# Patient Record
Sex: Female | Born: 2000 | Race: White | Hispanic: No | Marital: Married | Smoking: Never smoker
Health system: Southern US, Community
[De-identification: ages and names within clinical notes are randomized; demographics above are authoritative.]

## PROBLEM LIST (undated history)

## (undated) DIAGNOSIS — J45909 Unspecified asthma, uncomplicated: Secondary | ICD-10-CM

## (undated) DIAGNOSIS — R55 Syncope and collapse: Secondary | ICD-10-CM

## (undated) HISTORY — PX: NO PAST SURGERIES: SHX2092

## (undated) HISTORY — DX: Unspecified asthma, uncomplicated: J45.909

---
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
## 2021-10-09 ENCOUNTER — Other Ambulatory Visit: Payer: Self-pay | Admitting: Physician Assistant

## 2021-10-09 DIAGNOSIS — R519 Headache, unspecified: Secondary | ICD-10-CM

## 2021-10-09 DIAGNOSIS — H532 Diplopia: Secondary | ICD-10-CM

## 2021-10-21 ENCOUNTER — Ambulatory Visit
Admission: RE | Admit: 2021-10-21 | Discharge: 2021-10-21 | Disposition: A | Discharge status: Home / Self Care | Payer: Medicaid Other | Source: Ambulatory Visit | Attending: Physician Assistant | Admitting: Physician Assistant

## 2021-10-21 DIAGNOSIS — R519 Headache, unspecified: Secondary | ICD-10-CM | POA: Insufficient documentation

## 2021-10-21 DIAGNOSIS — H532 Diplopia: Secondary | ICD-10-CM | POA: Insufficient documentation

## 2021-10-21 MED ORDER — GADOBUTROL 1 MMOL/ML IV SOLN
5.0000 mL | Freq: Once | INTRAVENOUS | Status: AC | PRN
  Administered 2021-10-21: 5 mL via INTRAVENOUS

## 2022-06-15 ENCOUNTER — Ambulatory Visit
Admission: EM | Admit: 2022-06-15 | Discharge: 2022-06-15 | Disposition: A | Discharge status: Home / Self Care | Payer: Medicaid Other | Attending: Emergency Medicine | Admitting: Emergency Medicine

## 2022-06-15 ENCOUNTER — Other Ambulatory Visit: Payer: Self-pay

## 2022-06-15 ENCOUNTER — Encounter: Payer: Self-pay | Admitting: Emergency Medicine

## 2022-06-15 DIAGNOSIS — U071 COVID-19: Secondary | ICD-10-CM

## 2022-06-15 DIAGNOSIS — Z20822 Contact with and (suspected) exposure to covid-19: Secondary | ICD-10-CM | POA: Insufficient documentation

## 2022-06-15 DIAGNOSIS — J4521 Mild intermittent asthma with (acute) exacerbation: Secondary | ICD-10-CM | POA: Insufficient documentation

## 2022-06-15 LAB — RESP PANEL BY RT-PCR (FLU A&B, COVID) ARPGX2
Influenza A by PCR: NEGATIVE
Influenza B by PCR: NEGATIVE
SARS Coronavirus 2 by RT PCR: POSITIVE — AB

## 2022-06-15 MED ORDER — NIRMATRELVIR/RITONAVIR (PAXLOVID)TABLET: 3.0000 | ORAL_TABLET | Freq: Two times a day (BID) | ORAL | 0 refills | Status: AC

## 2022-06-15 MED ORDER — IPRATROPIUM BROMIDE 0.06 % NA SOLN: 2.0000 | Freq: Four times a day (QID) | NASAL | 0 refills | Status: DC

## 2022-06-15 MED ORDER — ALBUTEROL SULFATE HFA 108 (90 BASE) MCG/ACT IN AERS: 1.0000 | INHALATION_SPRAY | RESPIRATORY_TRACT | 0 refills | Status: DC | PRN

## 2022-06-15 MED ORDER — IBUPROFEN 600 MG PO TABS: 600.0000 mg | ORAL_TABLET | Freq: Four times a day (QID) | ORAL | 0 refills | Status: DC | PRN

## 2022-06-15 MED ORDER — AEROCHAMBER MV MISC: 1 refills | Status: DC

## 2022-06-15 MED ORDER — PROMETHAZINE-DM 6.25-15 MG/5ML PO SYRP: 5.0000 mL | ORAL_SOLUTION | Freq: Four times a day (QID) | ORAL | 0 refills | Status: DC | PRN

## 2022-06-15 NOTE — Discharge Instructions (Addendum)
Your COVID is positive.  Finish the Paxlovid, even if you feel better.  2 puffs from your albuterol inhaler using your spacer every 4 hours for 2 days, then every 6 hours for 2 days, then as needed.  You can back off on the albuterol if you start to improve sooner.  Promethazine DM will also help with cough.  Start Mucinex-D to keep the mucous thin and to decongest you.   You may take 600 mg of motrin with 1000 mg of tylenol up to 3-4 times a day as needed for pain. This is an effective combination for pain.  Use a NeilMed sinus rinse with distilled water as often as you want to to reduce nasal congestion. Follow the directions on the box.   Here is a list of primary care providers who are taking new patients:  [facility] [place] Dr. Joseph Berkshire (sports medicine) Dr. Elizabeth Sauer [address] [place] [address] [phone]  [facility] at [facility] [address] [place], [place] [address] [phone]  [facility] [place] [address]  [place] [address]  [phone]  [facility] [address] [place], [place] [address] [phone]  [facility] [address] [place]  [phone] [place], [place] [address]  Here are clinics/ other resources who will see you if you do not have insurance. Some have certain criteria that you must meet. Call them and find out what they are:  [facility]: [address] [place], [place] [address] Phone: [phone] Hours: First and Third Saturdays of each Month, 9 a.m. - 1 p.m.  [facility]: [address], [address] [place], [place] [address] Phone: [phone] Hours: Tuesday, 4 p.m. - 8 p.m. Thursday, 1 p.m. - 8 p.m. Wednesday, 9 a.m. - [facility] [address] [place], [place] [address] Phone: [phone] Pharmacy Phone Number: [phone] Dental Phone Number: [phone] [facility] Insurance Help: [phone]  Dental Hours: Monday - Thursday, 8 a.m. - 6  p.m.  [facility] [address], [place], [place] [address] Phone: [phone] Pharmacy Phone Number: [phone] [facility] Insurance Help: [phone]  [facility] [address] [place] [place], [place] [address] Phone: [phone] Pharmacy Phone Number: [phone] [facility] Insurance Help: [phone]  [facility] [address] [place], [place] [address] Phone: [phone] [facility] Insurance Help: [phone]   [facility] [address] [place], [place] [address] Phone: [phone]  Go to www.goodrx.com  or www.costplusdrugs.com to look up your medications. This will give you a list of where you can find your prescriptions at the most affordable prices. Or ask the pharmacist what the cash price is, or if they have any other discount programs available to help make your medication more affordable. This can be less expensive than what you would pay with insurance.

## 2022-06-15 NOTE — ED Provider Notes (Signed)
HPI  SUBJECTIVE:  Andrea Flowers is a 21 y.o. female who presents with body aches, headaches, nasal congestion, rhinorrhea, sore throat, postnasal drip, cough occasionally productive of scant white mucus, wheezing, shortness of breath with exertion and diffuse chest tightness starting yesterday.  Her grandmother, who she was with over Thanksgiving, tested positive for COVID yesterday.  No fevers, loss of sense of smell or taste, nausea, vomiting, diarrhea.  No known flu exposure.  She did not get the COVID or flu vaccines.  She has tried extra strength Sudafed, cough drops and hot liquids without improvement in her symptoms.  Symptoms are worse with exertion.  She has a past medical history of asthma, does not have an inhaler at home.  She has never been admitted for her asthma, no recent steroid use.  LMP: November 15.  Denies possibility of being pregnant.  PCP: None.   Past Medical History:  Diagnosis Date   Asthma    Syncope     Past Surgical History:  Procedure Laterality Date   NO PAST SURGERIES      Family History  Problem Relation Age of Onset   Diabetes Father     Social History   Tobacco Use   Smoking status: Never   Smokeless tobacco: Never  Vaping Use   Vaping Use: Former  Substance Use Topics   Alcohol use: No    Alcohol/week: 0.0 standard drinks of alcohol   Drug use: No    No current facility-administered medications for this encounter.  Current Outpatient Medications:    albuterol (VENTOLIN HFA) 108 (90 Base) MCG/ACT inhaler, Inhale 1-2 puffs into the lungs every 4 (four) hours as needed for wheezing or shortness of breath., Disp: 1 each, Rfl: 0   ibuprofen (ADVIL) 600 MG tablet, Take 1 tablet (600 mg total) by mouth every 6 (six) hours as needed., Disp: 30 tablet, Rfl: 0   ipratropium (ATROVENT) 0.06 % nasal spray, Place 2 sprays into both nostrils 4 (four) times daily., Disp: 15 mL, Rfl: 0   nirmatrelvir/ritonavir EUA (PAXLOVID) 20 x 150 MG & 10 x 100MG   TABS, Take 3 tablets by mouth 2 (two) times daily for 5 days. Patient GFR is 129. Take nirmatrelvir (150 mg) two tablets twice daily for 5 days and ritonavir (100 mg) one tablet twice daily for 5 days., Disp: 30 tablet, Rfl: 0   promethazine-dextromethorphan (PROMETHAZINE-DM) 6.25-15 MG/5ML syrup, Take 5 mLs by mouth 4 (four) times daily as needed for cough., Disp: 118 mL, Rfl: 0   Spacer/Aero-Holding Chambers (AEROCHAMBER MV) inhaler, Use as instructed, Disp: 1 each, Rfl: 1   escitalopram (LEXAPRO) 10 MG tablet, Take 10 mg by mouth daily. (Patient not taking: Reported on 06/15/2022), Disp: , Rfl:    Ferrous Sulfate (IRON) 325 (65 Fe) MG TABS, Take 1 tablet by mouth daily., Disp: , Rfl:   No Known Allergies   ROS  As noted in HPI.   Physical Exam  BP 130/83 (BP Location: Right Arm)   Pulse (!) 120   Temp 100.3 F (37.9 C) (Oral)   Resp 20   LMP 06/02/2022   SpO2 100%   Constitutional: Well developed, well nourished, no acute distress Eyes:  EOMI, conjunctiva normal bilaterally HENT: Normocephalic, atraumatic,mucus membranes moist and erythematous, swollen redness.  Clear nasal congestion.  Mild maxillary sinus tenderness.  Normal tonsils without exudates.  Uvula midline.  No postnasal drip. Neck: No cervical lymphadenopathy Respiratory: Normal inspiratory effort, occasional expiratory wheezing.  Positive anterior, lateral chest wall tenderness.  Cardiovascular: Regular tachycardia, no murmurs, rubs, gallops GI: nondistended skin: No rash, skin intact Musculoskeletal: no deformities Neurologic: Alert & oriented x 3, no focal neuro deficits Psychiatric: Speech and behavior appropriate   ED Course   Medications - No data to display  Orders Placed This Encounter  Procedures   Resp Panel by RT-PCR (Flu A&B, Covid) Anterior Nasal Swab    Standing Status:   Standing    Number of Occurrences:   1    Results for orders placed or performed during the hospital encounter of  06/15/22 (from the past 24 hour(s))  Resp Panel by RT-PCR (Flu A&B, Covid) Anterior Nasal Swab     Status: Abnormal   Collection Time: 06/15/22  2:54 PM   Specimen: Anterior Nasal Swab  Result Value Ref Range   SARS Coronavirus 2 by RT PCR POSITIVE (A) NEGATIVE   Influenza A by PCR NEGATIVE NEGATIVE   Influenza B by PCR NEGATIVE NEGATIVE   No results found.  ED Clinical Impression  1. COVID-19 virus infection   2. Close exposure to COVID-19 virus   3. Mild intermittent asthma with acute exacerbation      ED Assessment/Plan      Patient with an acute illness with systemic symptoms of tachycardia and fever.  Patient declined antipyretics.  Patient COVID-positive with an asthma exacerbation.  GFR 129 based on labs from 09/18/2021 Home with Paxlovid, albuterol inhaler with a spacer, Atrovent nasal spray, saline nasal irrigation, Mucinex D, Promethazine DM.  Follow-up with PCP of choice. Will provide primary care list.  Work note.  Discussed labs, MDM, treatment plan, and plan for follow-up with patient. Discussed sn/sx that should prompt return to the ED. patient agrees with plan.   Meds ordered this encounter  Medications   albuterol (VENTOLIN HFA) 108 (90 Base) MCG/ACT inhaler    Sig: Inhale 1-2 puffs into the lungs every 4 (four) hours as needed for wheezing or shortness of breath.    Dispense:  1 each    Refill:  0   ipratropium (ATROVENT) 0.06 % nasal spray    Sig: Place 2 sprays into both nostrils 4 (four) times daily.    Dispense:  15 mL    Refill:  0   promethazine-dextromethorphan (PROMETHAZINE-DM) 6.25-15 MG/5ML syrup    Sig: Take 5 mLs by mouth 4 (four) times daily as needed for cough.    Dispense:  118 mL    Refill:  0   Spacer/Aero-Holding Chambers (AEROCHAMBER MV) inhaler    Sig: Use as instructed    Dispense:  1 each    Refill:  1   ibuprofen (ADVIL) 600 MG tablet    Sig: Take 1 tablet (600 mg total) by mouth every 6 (six) hours as needed.    Dispense:  30  tablet    Refill:  0   nirmatrelvir/ritonavir EUA (PAXLOVID) 20 x 150 MG & 10 x 100MG  TABS    Sig: Take 3 tablets by mouth 2 (two) times daily for 5 days. Patient GFR is 129. Take nirmatrelvir (150 mg) two tablets twice daily for 5 days and ritonavir (100 mg) one tablet twice daily for 5 days.    Dispense:  30 tablet    Refill:  0      *This clinic note was created using Scientist, clinical (histocompatibility and immunogenetics). Therefore, there may be occasional mistakes despite careful proofreading.  ?    Domenick Gong, MD 06/16/22 [phone]

## 2022-06-15 NOTE — ED Triage Notes (Signed)
Patient has a family member that has tested positive for covid last week. Patient started feeling bad yesterday.  Patient has scratchy throat, cough and sinus drainage and chest tightness.    Patient took sudafed, little relief and has had cough drops

## 2022-12-01 DIAGNOSIS — S46911A Strain of unspecified muscle, fascia and tendon at shoulder and upper arm level, right arm, initial encounter: Secondary | ICD-10-CM | POA: Diagnosis not present

## 2022-12-01 DIAGNOSIS — M25511 Pain in right shoulder: Secondary | ICD-10-CM | POA: Diagnosis not present

## 2022-12-09 DIAGNOSIS — M25511 Pain in right shoulder: Secondary | ICD-10-CM | POA: Diagnosis not present

## 2022-12-31 DIAGNOSIS — M25511 Pain in right shoulder: Secondary | ICD-10-CM | POA: Diagnosis not present

## 2023-05-31 ENCOUNTER — Encounter: Payer: Self-pay | Admitting: Family Medicine

## 2023-05-31 ENCOUNTER — Other Ambulatory Visit: Payer: Self-pay | Admitting: Family Medicine

## 2023-05-31 ENCOUNTER — Ambulatory Visit: Payer: BC Managed Care – PPO | Admitting: Family Medicine

## 2023-05-31 VITALS — BP 110/84 | Ht 59.25 in | Wt 165.0 lb

## 2023-05-31 DIAGNOSIS — E042 Nontoxic multinodular goiter: Secondary | ICD-10-CM

## 2023-05-31 DIAGNOSIS — R7309 Other abnormal glucose: Secondary | ICD-10-CM

## 2023-05-31 DIAGNOSIS — Z1159 Encounter for screening for other viral diseases: Secondary | ICD-10-CM

## 2023-05-31 DIAGNOSIS — Z0000 Encounter for general adult medical examination without abnormal findings: Secondary | ICD-10-CM

## 2023-05-31 DIAGNOSIS — Z7689 Persons encountering health services in other specified circumstances: Secondary | ICD-10-CM

## 2023-05-31 DIAGNOSIS — Z114 Encounter for screening for human immunodeficiency virus [HIV]: Secondary | ICD-10-CM

## 2023-05-31 NOTE — Progress Notes (Signed)
Subjective:    Patient ID: Andrea Flowers, female    DOB: 2000/12/03, 22 y.o.   MRN: [id number]  Andrea Flowers is a 22 y.o. female presenting on 05/31/2023 for Establish Care  Previously patient here at [facility] 2016 Amy Krebs FNP.  Works at [employer] in [place] for [employer]. Lives in [place]  HPI  Discussed the use of AI scribe software for clinical note transcription with the patient, who gave verbal consent to proceed.  She reports no specific health concerns or symptoms, but was encouraged by family members to establish a primary care provider.  History of Ovarian Cyst Cervical CA Screening The patient's medical history includes a cyst on the ovaries approximately four to five years ago, which was managed at [facility].  She is due for GYN evaluation and pap smear screening age 78+ She will notify us of preferred GYN provider for upcoming referral  Multinodular Goiter Thyroid Fam History Thyroid Prior evaluation with Thyroid US in past with nodular goiter. 2021 and 2022 She saw [facility] in 2023 and they recommended monitoring, labs were normal. She also has a family history of thyroid issues, with both her mother and maternal grandmother having thyroid conditions, and an aunt with an unspecified thyroid condition.  The patient is currently taking a daily multivitamin. She had some basic blood work done last year, including a mildly elevated prediabetes A1c and a slight anemia four years ago. The patient was advised to have her thyroid checked due to family history, but this has not yet been done this year. She has expressed interest in having this checked at her next appointment.  Iron Deficiency Anemia Prior lab >4 years ago History of anemia due to menstrual blood loss Now has normalized.  Health Maintenance: Declines vaccines today Due routine HIV Hep C screening     05/31/2023   10:42 AM 03/07/2015    2:43 PM  Depression screen PHQ 2/9  Decreased  Interest 0 0  Down, Depressed, Hopeless 0 0  PHQ - 2 Score 0 0       No data to display            Past Medical History:  Diagnosis Date   Asthma    Syncope    Past Surgical History:  Procedure Laterality Date   NO PAST SURGERIES     Social History   Socioeconomic History   Marital status: Single    Spouse name: Not on file   Number of children: Not on file   Years of education: Not on file   Highest education level: Not on file  Occupational History   Not on file  Tobacco Use   Smoking status: Never   Smokeless tobacco: Never  Vaping Use   Vaping status: Former  Substance and Sexual Activity   Alcohol use: No    Alcohol/week: 0.0 standard drinks of alcohol   Drug use: No   Sexual activity: Not on file  Other Topics Concern   Not on file  Social History Narrative   Not on file   Social Determinants of Health   Financial Resource Strain: Not on file  Food Insecurity: Not on file  Transportation Needs: Not on file  Physical Activity: Not on file  Stress: Not on file  Social Connections: Not on file  Intimate Partner Violence: Not on file   Family History  Problem Relation Age of Onset   Thyroid disease Mother    Diabetes Father  Thyroid disease Maternal Grandmother    Breast cancer Maternal Grandmother    Colon cancer Maternal Grandfather 55       UNSURE EXACT AGE. Treated.   Prostate cancer Paternal Grandfather    Thyroid disease Maternal Aunt    Current Outpatient Medications on File Prior to Visit  Medication Sig   Multiple Vitamins-Minerals (WOMENS MULTIVITAMIN PO) Take by mouth.   [DISCONTINUED] budesonide-formoterol (SYMBICORT) 80-4.5 MCG/ACT inhaler Inhale 2 puffs into the lungs 2 (two) times daily.   No current facility-administered medications on file prior to visit.    Review of Systems Per HPI unless specifically indicated above      Objective:    BP 110/84   Ht 4' 11.25" (1.505 m)   Wt 165 lb (74.8 kg)   LMP 05/04/2023    BMI 33.05 kg/m   Wt Readings from Last 3 Encounters:  05/31/23 165 lb (74.8 kg)  08/10/20 125 lb (56.7 kg) (49%, Z= -0.03)*  08/10/20 125 lb (56.7 kg) (49%, Z= -0.03)*   * Growth percentiles are based on CDC (Girls, 2-20 Years) data.    Physical Exam Vitals and nursing note reviewed.  Constitutional:      General: She is not in acute distress.    Appearance: Normal appearance. She is well-developed. She is not diaphoretic.     Comments: Well-appearing, comfortable, cooperative  HENT:     Head: Normocephalic and atraumatic.  Eyes:     General:        Right eye: No discharge.        Left eye: No discharge.     Conjunctiva/sclera: Conjunctivae normal.  Cardiovascular:     Rate and Rhythm: Normal rate.  Pulmonary:     Effort: Pulmonary effort is normal.  Skin:    General: Skin is warm and dry.     Findings: No erythema or rash.  Neurological:     Mental Status: She is alert and oriented to person, place, and time.  Psychiatric:        Mood and Affect: Mood normal.        Behavior: Behavior normal.        Thought Content: Thought content normal.     Comments: Well groomed, good eye contact, normal speech and thoughts    I have personally reviewed the radiology report from 03/08/21 on [facility] Thyroid US.  US Thyroid  Anatomical Region Laterality Modality  Neck -- Ultrasound   Impression  --Stable 1.8 cm TIRADS 1 cystic nodule within the thyroid isthmus. --Multiple additional subcentimeter thyroid nodules which also do not meet TIRADS criteria for follow-up, similar to prior.  ________________________________ Allene Pyo 1 (0 points): Benign- No FNA indication TI-RADS 2 (2 points): Not suspicious- No FNA indicated TI-RADS 3 (3 points): Mildly suspicious- FNA is > or = 2.5 cm, follow if > or = 1.5 cm TI-RADS 4 (4-6 points): Moderately suspicious- FNA if > or = 1.5 or follow if > or = 1.0 cm TI-RADS 5 (7 or more points): Highly suspicious- FNA if >=1.0 cm, follow if >=0.5  cm NOTE:  The TI-RADS classification of thyroid nodules has been adopted to standardize risk stratification based on a common lexicon to inform practitioners about which nodules warrant biopsy.  The imaging criteria for TI-RADS criteria and documentation are available online at [email or web address].[email or web address]/  Please see below for data measurements: Right thyroid: Sagittal 5.2 cm; AP 1.6 cm; Transverse 1.5 cm, previously 5.4 x 1.7 x 1.5 cm Left thyroid: Sagittal 4.8 cm; AP 1.7 cm; Transverse  1.2 cm, previously 5.4 x 1.6 x 1.2 cm  Isthmus: 0.8 cm Narrative  EXAM: US THYROID DATE: 03/08/2021 11:20 PM ACCESSION: [id number] UN DICTATED: 03/09/2021 12:11 AM INTERPRETATION LOCATION: Main [facility]  CLINICAL INDICATION: 22 year old Female with neck discomfort, known thyroid cyst  COMPARISON: Thyroid ultrasound 07/01/2020  TECHNIQUE:  Ultrasound views of the thyroid were obtained using gray scale and limited color Doppler imaging.  FINDINGS: Thyroid size: See below for data measurements.  Thyroid echotexture: Multinodular thyroid. The largest nodule for reference is detailed below. Few additional subcentimeter thyroid nodules which do not meet TIRADS criteria for follow-up, similar to prior.  Nodule: 1 Size: 1.8 x 1.4 x 0.6 cm, previously 1.8 x 1.3 x 0.8 cm Location: Isthmus Composition: cystic or almost completely cystic (0). Few thin septations. Echogenicity: Anechoic (0) Shape: Not taller than wide (0) Margin: Smooth (0) Echogenic foci: None (0)  ACR TI-RADS total points: 0 ACR TI-RADS risk category: TI-RADS 0 ACR TI-RADS recommendation: No further follow up needed       Assessment & Plan:   Problem List Items Addressed This Visit   None Visit Diagnoses     Encounter to establish care with new doctor    -  Primary   Multinodular goiter       Abnormal glucose          Establish care as new patient here. Review outside records. Prior PCP at [facility] 8 years ago.    Multinodular Goiter Thyroid Family History of Thyroid Disease Prior evaluation by imaging US Thyroid in 2022 and Endocrinology in 2023, [facility] No change documented on Thyroid US from 2021 to 2022. Endocrine recommended future repeat Thyroid US Discussed family history of thyroid disease. No current symptoms reported. Recheck thyroid panel in 3 months.  Prediabetes Last Lab A1c 5.8 2023 Check lab in 3 months Encourage healthy lifestyle diet  History of Anemia Previous history of mild anemia related to menstrual cycle. No current symptoms reported. -Plan to recheck iron levels and complete blood count in 3 months.  Prevention Screening Family history of breast, prostate, and colon cancer.  -Age 62+ will Plan to refer to GYN for cervical cancer screening (Pap smear). Patient to confirm preferred provider.       No orders of the defined types were placed in this encounter.     Follow up plan: Return in about 3 months (around 08/31/2023) for 3 month fasting lab > 1 week later Annual Physical.  Future labs 08/25/23 CMET CBC TSH T4 A1c HIV Hep C  Saralyn Pilar, DO [facility] 05/31/2023, 10:00 AM

## 2023-05-31 NOTE — Patient Instructions (Addendum)
Thank you for coming to the office today.  Look into the GYN options - Usually refer to [facility] OR - [facility] and [place]) - Let me know on a message or call which doctor/provider you like or which office, or we can talk about it next time.  DUE for FASTING BLOOD WORK (no food or drink after midnight before the lab appointment, only water or coffee without cream/sugar on the morning of)  SCHEDULE "Lab Only" visit in the morning at the clinic for lab draw in 3 MONTHS   - Make sure Lab Only appointment is at about 1 week before your next appointment, so that results will be available  For Lab Results, once available within 2-3 days of blood draw, you can can log in to MyChart online to view your results and a brief explanation. Also, we can discuss results at next follow-up visit.   Please schedule a Follow-up Appointment to: Return in about 3 months (around 08/31/2023) for 3 month fasting lab > 1 week later Annual Physical.  If you have any other questions or concerns, please feel free to call the office or send a message through MyChart. You may also schedule an earlier appointment if necessary.  Additionally, you may be receiving a survey about your experience at our office within a few days to 1 week by e-mail or mail. We value your feedback.  Saralyn Pilar, DO [facility], [place]

## 2023-08-25 ENCOUNTER — Other Ambulatory Visit: Payer: Self-pay

## 2023-08-25 DIAGNOSIS — E042 Nontoxic multinodular goiter: Secondary | ICD-10-CM | POA: Diagnosis not present

## 2023-08-25 DIAGNOSIS — R7309 Other abnormal glucose: Secondary | ICD-10-CM | POA: Diagnosis not present

## 2023-08-25 DIAGNOSIS — Z114 Encounter for screening for human immunodeficiency virus [HIV]: Secondary | ICD-10-CM

## 2023-08-25 DIAGNOSIS — Z1159 Encounter for screening for other viral diseases: Secondary | ICD-10-CM

## 2023-08-25 DIAGNOSIS — Z0000 Encounter for general adult medical examination without abnormal findings: Secondary | ICD-10-CM

## 2023-08-26 LAB — COMPLETE METABOLIC PANEL WITH GFR
AG Ratio: 1.5 (calc) (ref 1.0–2.5)
ALT: 15 U/L (ref 6–29)
AST: 15 U/L (ref 10–30)
Albumin: 4.3 g/dL (ref 3.6–5.1)
Alkaline phosphatase (APISO): 105 U/L (ref 31–125)
BUN: 12 mg/dL (ref 7–25)
CO2: 23 mmol/L (ref 20–32)
Calcium: 9.6 mg/dL (ref 8.6–10.2)
Chloride: 105 mmol/L (ref 98–110)
Creat: 0.6 mg/dL (ref 0.50–0.96)
Globulin: 2.9 g/dL (ref 1.9–3.7)
Glucose, Bld: 101 mg/dL — ABNORMAL HIGH (ref 65–99)
Potassium: 4.3 mmol/L (ref 3.5–5.3)
Sodium: 137 mmol/L (ref 135–146)
Total Bilirubin: 0.4 mg/dL (ref 0.2–1.2)
Total Protein: 7.2 g/dL (ref 6.1–8.1)
eGFR: 131 mL/min/{1.73_m2} (ref >=60)

## 2023-08-26 LAB — CBC WITH DIFFERENTIAL/PLATELET
Absolute Lymphocytes: 2066 {cells}/uL (ref 850–3900)
Absolute Monocytes: 496 {cells}/uL (ref 200–950)
Basophils Absolute: 51 {cells}/uL (ref 0–200)
Basophils Relative: 0.7 %
Eosinophils Absolute: 131 {cells}/uL (ref 15–500)
Eosinophils Relative: 1.8 %
HCT: 39.9 % (ref 35.0–45.0)
Hemoglobin: 13.2 g/dL (ref 11.7–15.5)
MCH: 27.6 pg (ref 27.0–33.0)
MCHC: 33.1 g/dL (ref 32.0–36.0)
MCV: 83.5 fL (ref 80.0–100.0)
MPV: 10.3 fL (ref 7.5–12.5)
Monocytes Relative: 6.8 %
Neutro Abs: 4555 {cells}/uL (ref 1500–7800)
Neutrophils Relative %: 62.4 %
Platelets: 324 10*3/uL (ref 140–400)
RBC: 4.78 10*6/uL (ref 3.80–5.10)
RDW: 13.4 % (ref 11.0–15.0)
Total Lymphocyte: 28.3 %
WBC: 7.3 10*3/uL (ref 3.8–10.8)

## 2023-08-26 LAB — T4, FREE: Free T4: 1.2 ng/dL (ref 0.8–1.8)

## 2023-08-26 LAB — HEPATITIS C ANTIBODY: Hepatitis C Ab: NONREACTIVE

## 2023-08-26 LAB — HIV ANTIBODY (ROUTINE TESTING W REFLEX): HIV 1&2 Ab, 4th Generation: NONREACTIVE

## 2023-08-26 LAB — HEMOGLOBIN A1C
Hgb A1c MFr Bld: 5.7 %{Hb} — ABNORMAL HIGH (ref <5.7)
Mean Plasma Glucose: 117 mg/dL
eAG (mmol/L): 6.5 mmol/L

## 2023-08-26 LAB — TSH: TSH: 1.43 m[IU]/L

## 2023-09-01 ENCOUNTER — Ambulatory Visit: Payer: BC Managed Care – PPO | Admitting: Family Medicine

## 2023-09-01 ENCOUNTER — Other Ambulatory Visit: Payer: Self-pay

## 2023-09-01 ENCOUNTER — Other Ambulatory Visit: Payer: Self-pay | Admitting: Family Medicine

## 2023-09-01 ENCOUNTER — Encounter: Payer: Self-pay | Admitting: Family Medicine

## 2023-09-01 VITALS — BP 108/68 | HR 88 | Ht 59.25 in | Wt 175.0 lb

## 2023-09-01 DIAGNOSIS — E669 Obesity, unspecified: Secondary | ICD-10-CM

## 2023-09-01 DIAGNOSIS — Z0000 Encounter for general adult medical examination without abnormal findings: Secondary | ICD-10-CM | POA: Diagnosis not present

## 2023-09-01 DIAGNOSIS — R7309 Other abnormal glucose: Secondary | ICD-10-CM

## 2023-09-01 DIAGNOSIS — E042 Nontoxic multinodular goiter: Secondary | ICD-10-CM

## 2023-09-01 MED ORDER — CONTRAVE 8-90 MG PO TB12: ORAL_TABLET | ORAL | Status: DC

## 2023-09-01 NOTE — Progress Notes (Signed)
Subjective:    Patient ID: Andrea Flowers, female    DOB: 07/22/2000, 23 y.o.   MRN: [id number]  Andrea Flowers is a 23 y.o. female presenting on 09/01/2023 for Annual Exam   HPI  Discussed the use of AI scribe software for clinical note transcription with the patient, who gave verbal consent to proceed.  History of Present Illness    Andrea Flowers is a 23 year old female who presents for an annual physical exam.  She has been healthy in the past few weeks with no recent illnesses.     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 Past History of eating disorder issue with restrictive type, but his has resolved. Interested in medication options She has attempted weight loss with diet exercise regimen >6+ months without success, note weight gain +50 lbs in past 3 years.  History of Ovarian Cyst Cervical CA Screening The patient's medical history includes a cyst on the ovaries approximately >5 years ago, which was managed at [facility].  She is due for GYN evaluation and pap smear screening age 68+ She will notify us of preferred GYN provider for upcoming referral   Multinodular Goiter Thyroid Fam History Thyroid Prior evaluation with Thyroid US in past with nodular goiter. 2021 and 2022 She saw [facility] in 2023 and they recommended monitoring, labs were normal. She also has a family history of thyroid issues, with both her mother and maternal grandmother having thyroid conditions, and an aunt with an unspecified thyroid condition. -Thyroid labs now are normal    Iron Deficiency Anemia History of anemia due to menstrual blood loss Currently normalized. No active anemia   Health Maintenance: NCIR Vaccine reviewed.  Noted completed HPV series in childhood      09/01/2023    8:16 AM 05/31/2023   10:42 AM 03/07/2015    2:43 PM  Depression screen PHQ 2/9  Decreased  Interest 0 0 0  Down, Depressed, Hopeless 0 0 0  PHQ - 2 Score 0 0 0  Altered sleeping 2    Tired, decreased energy 0    Change in appetite 2    Feeling bad or failure about yourself  0    Trouble concentrating 0    Moving slowly or fidgety/restless 0    Suicidal thoughts 0    PHQ-9 Score 4    Difficult doing work/chores Somewhat difficult         09/01/2023    8:16 AM  GAD 7 : Generalized Anxiety Score  Nervous, Anxious, on Edge 0  Control/stop worrying 0  Worry too much - different things 0  Trouble relaxing 2  Restless 1  Easily annoyed or irritable 0  Afraid - awful might happen 0  Total GAD 7 Score 3  Anxiety Difficulty Somewhat difficult     Past Medical History:  Diagnosis Date   Asthma    Syncope    Past Surgical History:  Procedure Laterality Date   NO PAST SURGERIES     Social History   Socioeconomic History   Marital status: Single    Spouse name: Not on file   Number of children: Not on file   Years of education: Not on file   Highest education level: Not on file  Occupational History   Not on file  Tobacco Use   Smoking status: Never   Smokeless tobacco: Never  Vaping  Use   Vaping status: Former  Substance and Sexual Activity   Alcohol use: No    Alcohol/week: 0.0 standard drinks of alcohol   Drug use: No   Sexual activity: Not on file  Other Topics Concern   Not on file  Social History Narrative   Not on file   Social Drivers of Health   Financial Resource Strain: Not on file  Food Insecurity: Not on file  Transportation Needs: Not on file  Physical Activity: Not on file  Stress: Not on file  Social Connections: Not on file  Intimate Partner Violence: Not on file   Family History  Problem Relation Age of Onset   Thyroid disease Mother    Diabetes Father    Thyroid disease Maternal Grandmother    Breast cancer Maternal Grandmother    Colon cancer Maternal Grandfather 75       UNSURE EXACT AGE. Treated.   Diabetes Paternal  Grandmother    Diabetes Paternal Grandfather    Prostate cancer Paternal Grandfather    Thyroid disease Maternal Aunt    Current Outpatient Medications on File Prior to Visit  Medication Sig   Multiple Vitamins-Minerals (WOMENS MULTIVITAMIN PO) Take by mouth.   [DISCONTINUED] budesonide-formoterol (SYMBICORT) 80-4.5 MCG/ACT inhaler Inhale 2 puffs into the lungs 2 (two) times daily.   No current facility-administered medications on file prior to visit.    Review of Systems  Constitutional:  Negative for activity change, appetite change, chills, diaphoresis, fatigue and fever.  HENT:  Negative for congestion and hearing loss.   Eyes:  Negative for visual disturbance.  Respiratory:  Negative for cough, chest tightness, shortness of breath and wheezing.   Cardiovascular:  Negative for chest pain, palpitations and leg swelling.  Gastrointestinal:  Negative for abdominal pain, constipation, diarrhea, nausea and vomiting.  Genitourinary:  Negative for dysuria, frequency and hematuria.  Musculoskeletal:  Negative for arthralgias and neck pain.  Skin:  Negative for rash.  Neurological:  Negative for dizziness, weakness, light-headedness, numbness and headaches.  Hematological:  Negative for adenopathy.  Psychiatric/Behavioral:  Negative for behavioral problems, dysphoric mood and sleep disturbance.    Per HPI unless specifically indicated above     Objective:    BP 108/68   Pulse 88   Ht 4' 11.25" (1.505 m)   Wt 175 lb (79.4 kg)   SpO2 99%   BMI 35.05 kg/m   Wt Readings from Last 3 Encounters:  09/01/23 175 lb (79.4 kg)  05/31/23 165 lb (74.8 kg)  08/10/20 125 lb (56.7 kg) (49%, Z= -0.03)*   * Growth percentiles are based on CDC (Girls, 2-20 Years) data.    Physical Exam Vitals and nursing note reviewed.  Constitutional:      General: She is not in acute distress.    Appearance: She is well-developed. She is obese. She is not diaphoretic.     Comments: Well-appearing,  comfortable, cooperative  HENT:     Head: Normocephalic and atraumatic.  Eyes:     General:        Right eye: No discharge.        Left eye: No discharge.     Conjunctiva/sclera: Conjunctivae normal.     Pupils: Pupils are equal, round, and reactive to light.  Neck:     Thyroid: No thyromegaly.  Cardiovascular:     Rate and Rhythm: Normal rate and regular rhythm.     Pulses: Normal pulses.     Heart sounds: Normal heart sounds. No murmur heard. Pulmonary:  Effort: Pulmonary effort is normal. No respiratory distress.     Breath sounds: Normal breath sounds. No wheezing or rales.  Abdominal:     General: Bowel sounds are normal. There is no distension.     Palpations: Abdomen is soft. There is no mass.     Tenderness: There is no abdominal tenderness.  Musculoskeletal:        General: No tenderness. Normal range of motion.     Cervical back: Normal range of motion and neck supple.     Right lower leg: No edema.     Left lower leg: No edema.     Comments: Upper / Lower Extremities: - Normal muscle tone, strength bilateral upper extremities 5/5, lower extremities 5/5  Lymphadenopathy:     Cervical: No cervical adenopathy.  Skin:    General: Skin is warm and dry.     Findings: No erythema or rash.  Neurological:     Mental Status: She is alert and oriented to person, place, and time.     Comments: Distal sensation intact to light touch all extremities  Psychiatric:        Mood and Affect: Mood normal.        Behavior: Behavior normal.        Thought Content: Thought content normal.     Comments: Well groomed, good eye contact, normal speech and thoughts     Results for orders placed or performed in visit on 08/25/23  HIV Antibody (routine testing w rflx)   Collection Time: 08/25/23  8:14 AM  Result Value Ref Range   HIV 1&2 Ab, 4th Generation NON-REACTIVE NON-REACTIVE  Hepatitis C antibody   Collection Time: 08/25/23  8:14 AM  Result Value Ref Range   Hepatitis C Ab  NON-REACTIVE NON-REACTIVE  T4, free   Collection Time: 08/25/23  8:14 AM  Result Value Ref Range   Free T4 1.2 0.8 - 1.8 ng/dL  TSH   Collection Time: 08/25/23  8:14 AM  Result Value Ref Range   TSH 1.43 mIU/L  CBC with Differential/Platelet   Collection Time: 08/25/23  8:14 AM  Result Value Ref Range   WBC 7.3 3.8 - 10.8 Thousand/uL   RBC 4.78 3.80 - 5.10 Million/uL   Hemoglobin 13.2 11.7 - 15.5 g/dL   HCT 16.1 09.6 - 04.5 %   MCV 83.5 80.0 - 100.0 fL   MCH 27.6 27.0 - 33.0 pg   MCHC 33.1 32.0 - 36.0 g/dL   RDW 40.9 81.1 - 91.4 %   Platelets 324 140 - 400 Thousand/uL   MPV 10.3 7.5 - 12.5 fL   Neutro Abs 4,555 1,500 - 7,800 cells/uL   Absolute Lymphocytes 2,066 850 - 3,900 cells/uL   Absolute Monocytes 496 200 - 950 cells/uL   Eosinophils Absolute 131 15 - 500 cells/uL   Basophils Absolute 51 0 - 200 cells/uL   Neutrophils Relative % 62.4 %   Total Lymphocyte 28.3 %   Monocytes Relative 6.8 %   Eosinophils Relative 1.8 %   Basophils Relative 0.7 %  COMPLETE METABOLIC PANEL WITH GFR   Collection Time: 08/25/23  8:14 AM  Result Value Ref Range   Glucose, Bld 101 (H) 65 - 99 mg/dL   BUN 12 7 - 25 mg/dL   Creat 7.82 9.56 - 2.13 mg/dL   eGFR 086 > OR = 60 VH/QIO/9.62X5   BUN/Creatinine Ratio SEE NOTE: 6 - 22 (calc)   Sodium 137 135 - 146 mmol/L   Potassium 4.3 3.5 -  5.3 mmol/L   Chloride 105 98 - 110 mmol/L   CO2 23 20 - 32 mmol/L   Calcium 9.6 8.6 - 10.2 mg/dL   Total Protein 7.2 6.1 - 8.1 g/dL   Albumin 4.3 3.6 - 5.1 g/dL   Globulin 2.9 1.9 - 3.7 g/dL (calc)   AG Ratio 1.5 1.0 - 2.5 (calc)   Total Bilirubin 0.4 0.2 - 1.2 mg/dL   Alkaline phosphatase (APISO) 105 31 - 125 U/L   AST 15 10 - 30 U/L   ALT 15 6 - 29 U/L  Hemoglobin A1c   Collection Time: 08/25/23  8:14 AM  Result Value Ref Range   Hgb A1c MFr Bld 5.7 (H) <5.7 % of total Hgb   Mean Plasma Glucose 117 mg/dL   eAG (mmol/L) 6.5 mmol/L      Assessment & Plan:   Problem List Items Addressed This Visit    None Visit Diagnoses       Annual physical exam    -  Primary        Updated Health Maintenance information Reviewed recent lab results with patient Encouraged improvement to lifestyle with diet and exercise Goal of weight loss   Prediabetes A1c improved from 6.0 to 5.7, indicating better glycemic control. Family history of diabetes noted. Patient is engaging in regular exercise but struggles with overeating / craving and binge -Continue regular exercise and work on portion control. -Consider starting Contrave for appetite suppression and weight management, pending patient's interest and cost considerations.  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 -Discussed various weight loss medication options including Contrave, Wegovy, and Zepbound. -Provided patient with information to check insurance coverage for Wegovy and Zepbound. -Consider starting Contrave, provided a one-week sample. $99 cost reviewed  Cervical Cancer Screening Patient has not yet established care with a GYN and has not had a Pap smear. -Encouraged patient to establish care with a GYN for cervical cancer screening. -Provided patient with names of local GYN clinics.  Vaccinations Patient has completed HPV vaccination series in past, documented updates from NCIR review - Due Tdap -Administer Tdap vaccine at next convenient appointment.  Follow-up Annual wellness visit scheduled for one year. If weight loss medication is started, a follow-up visit in three months will be required to assess efficacy.         No orders of the defined types were placed in this encounter.   No orders of the defined types were placed in this encounter.    Follow up plan: Return in about 1 year (around 08/31/2024) for 1 year fasting lab > 1 week later Annual Physical.  Future labs 08/31/24  Saralyn Pilar, DO [facility] 09/01/2023, 8:10 AM

## 2023-09-01 NOTE — Patient Instructions (Addendum)
Thank you for coming to the office today.  Please work on scheduling new patient with GYN for Pap Smear Cervical CA screening  [facility] at [facility] [address] [place],  [place]  [address] Main: [phone]  Or [facility])  ---------------------------------------------------  Also checking into HPV vaccine history  -------------------------------------------------  Zepbound TO Check Cost & Coverage of Zepbound Please contact Research officer, trade union (manufacturer for [employer]) 1-800-LillyRx [phone]) - Live agent to discuss cost and coverage.  OVFIEP Verified [email or web address]   For Weight Loss / Obesity only  Contrave - oral medication, appetite suppression has wellbutrin/bupropion and naltrexone in it and it can also help with appetite, it is ordered through a speciality pharmacy. - $99 per month  Try 1 week sample Contrave - Week 1: 1 pill daily with meal - Week 2: 1 pill twice a day with meal - Week 3: 2 pill with meal in AM and 1 pill with PM meal - Week 4: 2 pill twice a day with meal   Please notify me after 1 week, and if doing well on Contrave, I can order to [facility] pharmacy $99 per month plan with this med.   Free sample 7 day, 1 pill per day for 1 week  Alternative options  Semaglutide injection (mixed Ozempic) from [facility] 0.25mg  weekly for 4 weeks then increase to 0.5mg  weekly It comes in a vial and a needle syringe, you need to draw up the shot and self admin it weekly Cost is about $200 per month Call them to check pricing and availability  [facility] Address: [address] [place], [place], [place] [address] Phone: [phone]  -----------------  [email or web address]  [email or web address]  Materials engineer Team With insurance $79/mo + copay for medications Without insurance $79/mo + $99/mo to include  medication (Semaglutide) Without insurance $79/mo + $199/mo to include medication (Tirzepatide) [email or web address]    Please schedule a Follow-up Appointment to: Return in about 1 year (around 08/31/2024) for 1 year fasting lab > 1 week later Annual Physical.  If you have any other questions or concerns, please feel free to call the office or send a message through MyChart. You may also schedule an earlier appointment if necessary.  Additionally, you may be receiving a survey about your experience at our office within a few days to 1 week by e-mail or mail. We value your feedback.  Saralyn Pilar, DO [facility], [place]

## 2023-09-17 ENCOUNTER — Encounter: Payer: Self-pay | Admitting: Family Medicine

## 2023-09-17 DIAGNOSIS — E669 Obesity, unspecified: Secondary | ICD-10-CM

## 2023-09-19 MED ORDER — WEGOVY 0.25 MG/0.5ML ~~LOC~~ SOAJ: 0.2500 mg | SUBCUTANEOUS | 0 refills | Status: DC

## 2023-09-19 NOTE — Telephone Encounter (Signed)
 ____________________________________________________ Additional Rx Information (May be used for Prior Authorization if required)  Medication name and Strength: WGNFAO 0.25mg   Primary Diagnosis and ICD10 Code: Obesity BMI >35-39 (E66.9) Secondary Diagnosis and ICD10 Code: N/a Previous Failed Medications None Quantity and Duration of New Medication: 2mL for 30 days Additional Supporting Information: Ineffective weight loss with diet exercise >6 months ____________________________________________________

## 2023-09-20 NOTE — Progress Notes (Signed)
 PA started   [employer] (Key: [facility]) Rx #: [id number] JXBJYN 0.25MG /0.5ML auto-injectors Form [employer] Electronic Prior Authorization Form [id number] NCPDP)

## 2023-09-27 ENCOUNTER — Other Ambulatory Visit: Payer: Self-pay | Admitting: Family Medicine

## 2023-09-27 DIAGNOSIS — E669 Obesity, unspecified: Secondary | ICD-10-CM

## 2023-09-27 MED ORDER — ZEPBOUND 2.5 MG/0.5ML ~~LOC~~ SOAJ: 2.5000 mg | SUBCUTANEOUS | 0 refills | Status: DC

## 2023-09-27 NOTE — Addendum Note (Signed)
 Addended by: Smitty Cords on: 09/27/2023 01:10 PM   Modules accepted: Orders

## 2023-09-28 NOTE — Telephone Encounter (Signed)
 Pharmacy comment: Alternative Requested:THE PRESCRIBED MEDICATION IS NOT COVERED BY INSURANCE. PLEASE CONSIDER CHANGING TO ONE OF THE SUGGESTED COVERED ALTERNATIVES.

## 2023-12-09 IMAGING — MR MR HEAD WO/W CM
15 series · 48 of 48 positions shown · IV contrast (gadavist)
Comparison: None.

CLINICAL DATA: Migraine and blurry vision

EXAM:
MRI HEAD WITHOUT AND WITH CONTRAST
TECHNIQUE: Multiplanar, multiecho pulse sequences of the brain and surrounding
structures were obtained without and with intravenous contrast.
CONTRAST:  5mL GADAVIST GADOBUTROL 1 MMOL/ML IV SOLN

[Series 5: ax dwi_tracew · axial · 3.0mm · 0.65mm/px · z∈[-111,+40]mm · 6 of 96 slices shown]
[im 1/96]
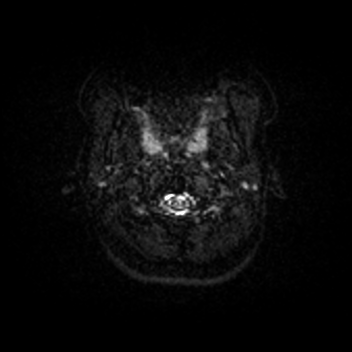
[im 20/96]
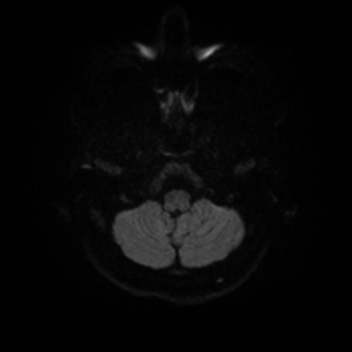
[im 39/96]
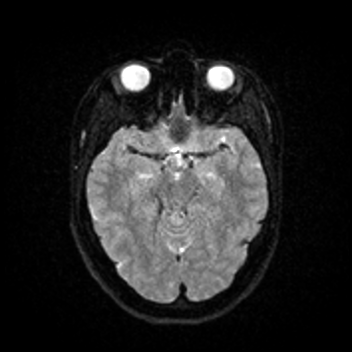
[im 58/96]
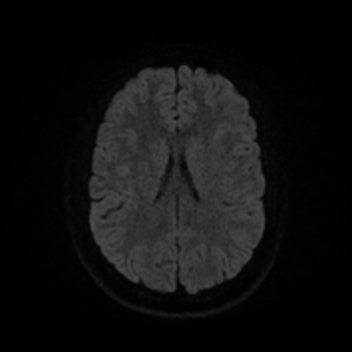
[im 77/96]
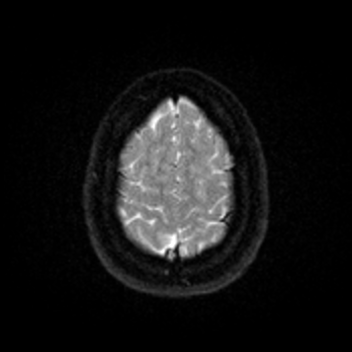
[im 96/96]
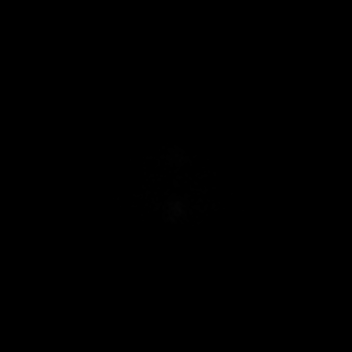

[Series 6: ax dwi_adc · axial · 3.0mm · 0.65mm/px · z∈[-111,+37]mm · 3 of 47 slices shown]
[im 1/47]
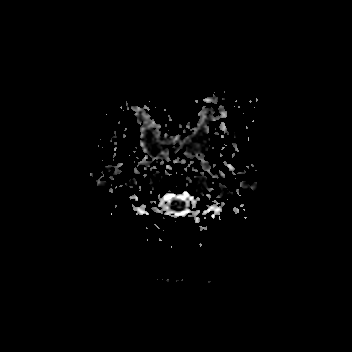
[im 24/47]
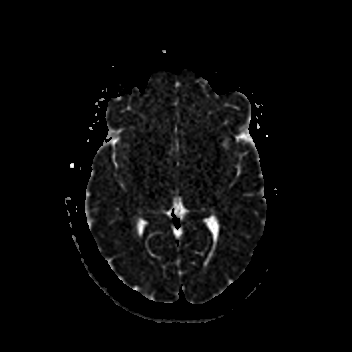
[im 47/47]
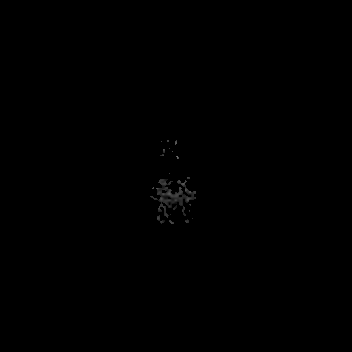

[Series 7: cor dwi_tracew · coronal · 5.0mm · 0.65mm/px · 2 of 40 slices shown]
[im 1/40]
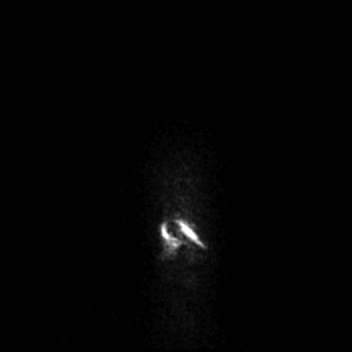
[im 40/40]
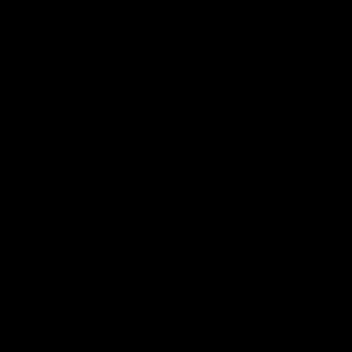

[Series 8: cor dwi_adc · coronal · 5.0mm · 0.65mm/px · 2 of 38 slices shown]
[im 1/38]
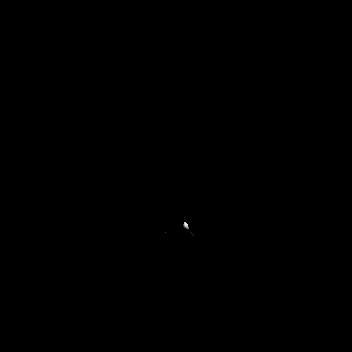
[im 38/38]
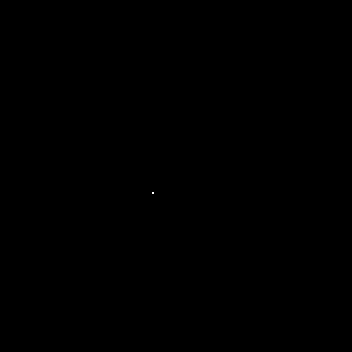

[Series 9: T1 · sagittal · 5.0mm · 0.62mm/px · 1 of 25 slices shown (1 of 2)]
[im 1/25]
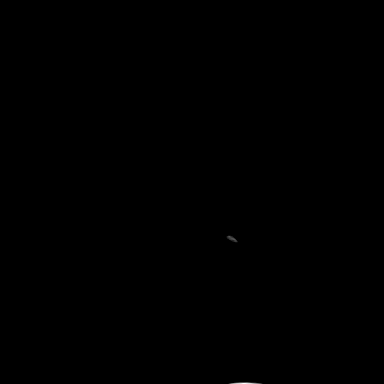

[Series 10: T2 · axial · 5.0mm · 0.53mm/px · 1 of 25 slices shown]
[im 1/25]
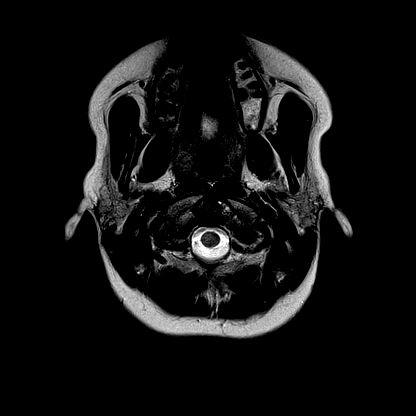

[Series 11: mag_images · axial · 3.0mm · 0.90mm/px · z∈[-120,+52]mm · 3 of 60 slices shown]
[im 1/60]
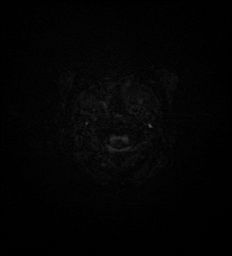
[im 30/60]
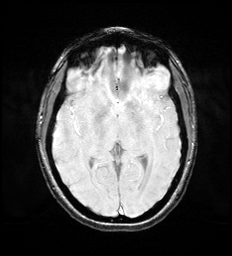
[im 60/60]
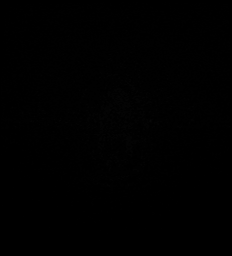

[Series 12: pha_images · axial · 3.0mm · 0.90mm/px · z∈[-120,+49]mm · 3 of 58 slices shown]
[im 1/58]
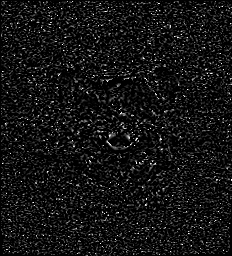
[im 29/58]
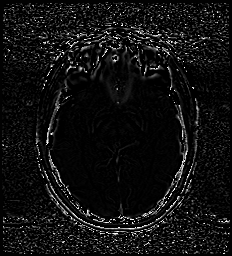
[im 58/58]
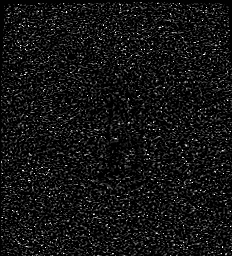

[Series 13: swi_images · axial · 3.0mm · 0.90mm/px · z∈[-120,+52]mm · 3 of 60 slices shown]
[im 1/60]
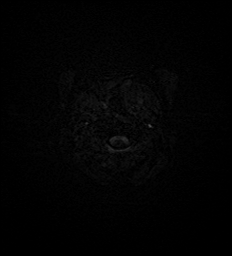
[im 30/60]
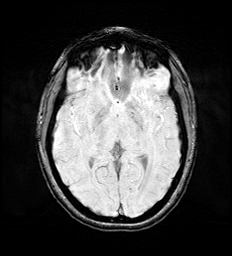
[im 60/60]
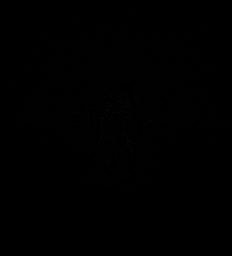

[Series 15: FLAIR · axial · 3.0mm · 0.53mm/px · z∈[-112,+46]mm · 3 of 55 slices shown]
[im 1/55]
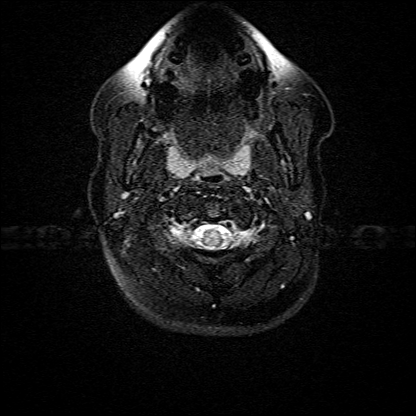
[im 28/55]
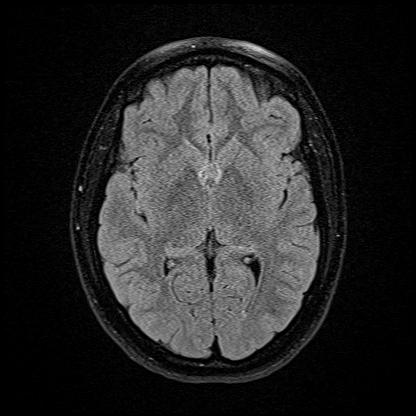
[im 55/55]
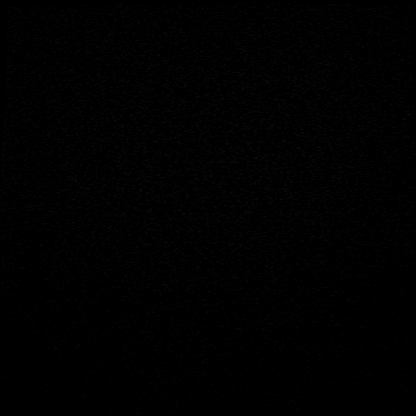

[Series 16: T1 · axial · 1.0mm · 0.98mm/px · z∈[-123,+47]mm · 9 of 176 slices shown (2 of 2)]
[im 1/176]
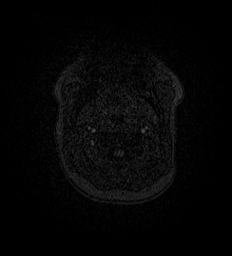
[im 22/176]
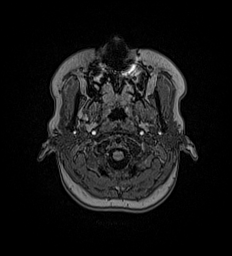
[im 44/176]
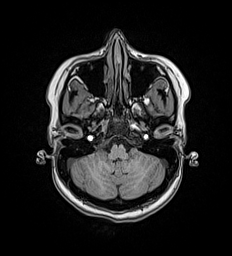
[im 66/176]
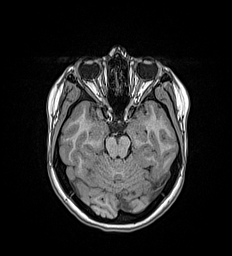
[im 88/176]
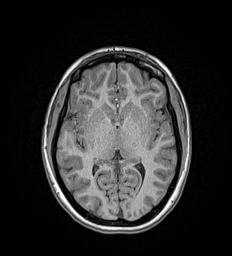
[im 110/176]
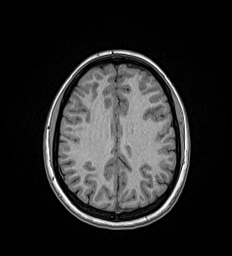
[im 132/176]
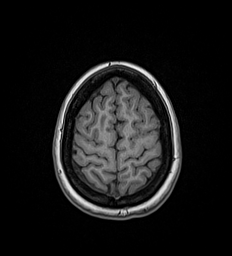
[im 154/176]
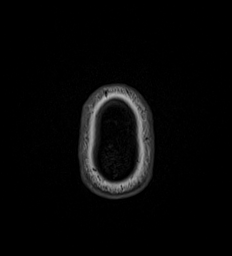
[im 176/176]
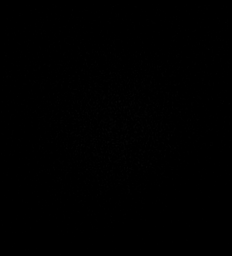

[Series 17: T2 post-contrast · coronal · 5.0mm · 0.57mm/px · 1 of 29 slices shown]
[im 1/29]
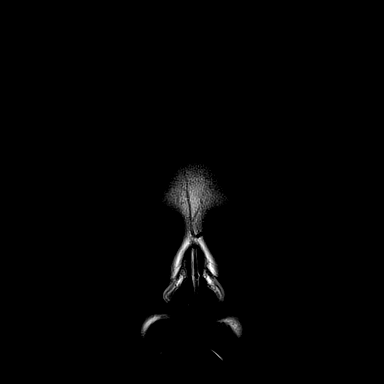

[Series 18: T1 post-contrast · axial · 1.0mm · 0.98mm/px · z∈[-123,+47]mm · 9 of 176 slices shown (1 of 3)]
[im 1/176]
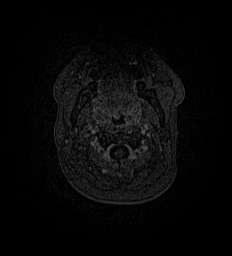
[im 22/176]
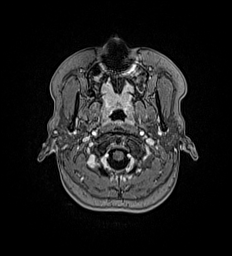
[im 44/176]
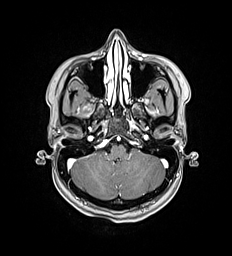
[im 66/176]
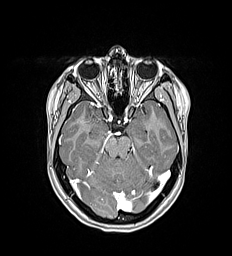
[im 88/176]
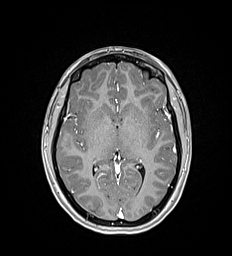
[im 110/176]
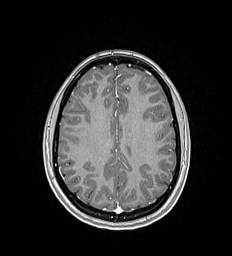
[im 132/176]
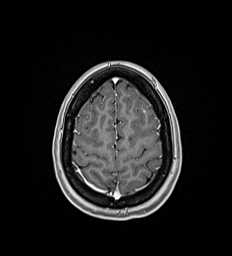
[im 154/176]
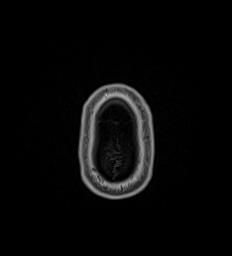
[im 176/176]
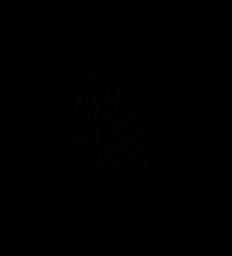

[Series 19: T1 post-contrast · coronal · 5.0mm · 0.57mm/px · 1 of 29 slices shown (2 of 3)]
[im 1/29]
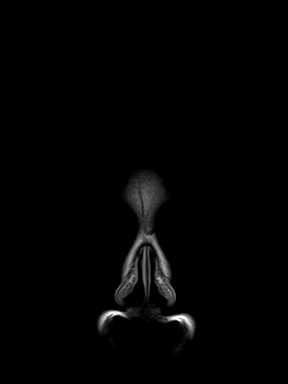

[Series 20: T1 post-contrast · sagittal · 5.0mm · 0.62mm/px · 1 of 25 slices shown (3 of 3)]
[im 1/25]
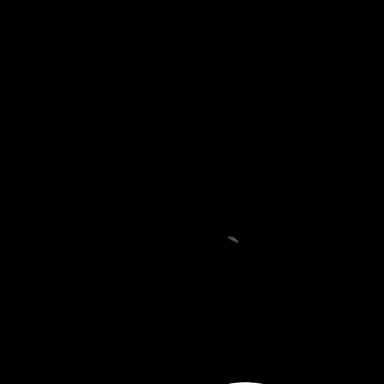

[48 of 48 positions shown; findings below may reference images not displayed]

FINDINGS: Brain: No acute infarct, mass effect or extra-axial collection. No
acute or chronic hemorrhage. Normal white matter signal, parenchymal
volume and CSF spaces. The midline structures are normal. There is
no abnormal contrast enhancement.

Vascular: Major flow voids are preserved.

Skull and upper cervical spine: Normal calvarium and skull base.
Visualized upper cervical spine and soft tissues are normal.

Sinuses/Orbits:No paranasal sinus fluid levels or advanced mucosal
thickening. No mastoid or middle ear effusion. Normal orbits.
IMPRESSION: Normal brain MRI.

## 2024-01-12 ENCOUNTER — Other Ambulatory Visit: Payer: Self-pay | Admitting: Family Medicine

## 2024-01-12 ENCOUNTER — Encounter: Payer: Self-pay | Admitting: *Deleted

## 2024-01-12 ENCOUNTER — Encounter: Payer: Self-pay | Admitting: Family Medicine

## 2024-01-12 ENCOUNTER — Ambulatory Visit: Payer: Self-pay | Admitting: *Deleted

## 2024-01-12 NOTE — Telephone Encounter (Signed)
 Called patient. I agreed to write her a work note for today and Friday 6/26 and 6/27 and return to work 6/30. Due to her anxiety at this time and acute stressors with family in hospital. She understands it is up to her employer to accept the work excuse and she will pick up the note today. If she needs any further time or extension or treatment of her anxiety she should schedule follow up so we can document and discuss a treatment plan  Marsa Officer, DO [facility] 01/12/2024, 10:50 AM

## 2024-01-12 NOTE — Telephone Encounter (Signed)
 Copied from CRM [id number]. Topic: Clinical - Red Word Triage >> Jan 12, 2024  9:10 AM Powell HERO wrote: Red Word that prompted transfer to Nurse Triage:Patients health counselor from work is calling to advise she is having extreme stress and anxiety and its affecting her job. Needing to speak with someone ASAP. Please reach out to the patient directly. Reason for Disposition  Symptoms interfere with work or school  Answer Assessment - Initial Assessment Questions 1. CONCERN: What happened that made you call today?     A counselor from her work called.   Pt is depressed.  I called her.   My little brother is in the hospital out of town.   He has been in since Sat.    I called out of work on Mon. And Tues and that's all the time I have.    I can't get off of work.   It's bothering me that I can't be with him.   I can't get off work.   I'm not on an antidepressant.    2. DEPRESSION SYMPTOM SCREENING: How are you feeling overall? (e.g., decreased energy, increased sleeping or difficulty sleeping, difficulty concentrating, feelings of sadness, guilt, hopelessness, or worthlessness)     I've not depressed.  I'm anxious more than anything.    Yesterday I had to come back from work.   Being away from my family is really bothering me. 3. RISK OF HARM - SUICIDAL IDEATION:  Do you ever have thoughts of hurting or killing yourself?  (e.g., yes, no, no but preoccupation with thoughts about death)   - INTENT:  Do you have thoughts of hurting or killing yourself right NOW? (e.g., yes, no, N/A)   - PLAN: Do you have a specific plan for how you would do this? (e.g., gun, knife, overdose, no plan, N/A)     No suicidal thoughts.    Just not being with my family is giving me anxiety.   4. RISK OF HARM - HOMICIDAL IDEATION:  Do you ever have thoughts of hurting or killing someone else?  (e.g., yes, no, no but preoccupation with thoughts about death)   - INTENT:  Do you have thoughts of hurting or killing  someone right NOW? (e.g., yes, no, N/A)   - PLAN: Do you have a specific plan for how you would do this? (e.g., gun, knife, no plan, N/A)      No suicidal thoughts or plans. 5. FUNCTIONAL IMPAIRMENT: How have things been going for you overall? Have you had more difficulty than usual doing your normal daily activities?  (e.g., better, same, worse; self-care, school, work, interactions)     Yes it's hard for me to concentrate due to the anxiety because I can't be with my family.     They are in [place], [place] my family. 6. SUPPORT: Who is with you now? Who do you live with? Do you have family or friends who you can talk to?      My wife and aunt and uncle are local.   I can talk with them.   7. THERAPIST: Do you have a counselor or therapist? Name?     No counselor or therapist 8. STRESSORS: Has there been any new stress or recent changes in your life?     Yes see above 9. ALCOHOL USE OR SUBSTANCE USE (DRUG USE): Do you drink alcohol or use any illegal drugs?     I had stopped vaping but recently I started again with  vaping.    10. OTHER: Do you have any other physical symptoms right now? (e.g., fever)       I'm anxious.    I'm not sleeping well.   I'm not eating much.   I feeling sick on my stomach.   Pt is crying and upset that she can't be with her family. 11. PREGNANCY: Is there any chance you are pregnant? When was your last menstrual period?       Not asked  Protocols used: Depression-A-AH FYI Only or Action Required?: Action required by provider: request for appointment. A counselor from her work place asked if we could call pt and touch base with her regarding being depressed and having anxiety. I called pt. Her little brother is in the hospital in [place], [place].   She is having severe anxiety because she can't be there with her family.   She called off work [employer]. And Tues. But has used up all her sick leave time.   She cad to come back to work and is having a very  difficult time concentrating.    She is requesting a note from Dr. Edman so she can be off of work to be with her family in [place], [place].    There are no appts until July.   I've sent a high priority message to see if she can be worked in.   Pt was agreeable to this plan.   I let her know someone would be calling her back.   She is not suicidal just very upset that she can't be with her family during this difficult time.            Patient was last seen in primary care on 09/01/2023 by Edman Marsa PARAS, DO. Called Nurse Triage reporting Depression. Symptoms began a week ago. Interventions attempted: Other: Needing a note for work. Symptoms are: rapidly worsening The anxiety.  Triage Disposition: See Physician Within 24 Hours  Patient/caregiver understands and will follow disposition?:  Yes  Can pt be worked in?

## 2024-01-13 NOTE — Telephone Encounter (Signed)
 Will defer to PCP, I know he was working on this yesterday

## 2024-01-17 ENCOUNTER — Ambulatory Visit (INDEPENDENT_AMBULATORY_CARE_PROVIDER_SITE_OTHER): Admitting: Family Medicine

## 2024-01-17 ENCOUNTER — Encounter: Payer: Self-pay | Admitting: Family Medicine

## 2024-01-17 VITALS — BP 122/80 | HR 84 | Ht 59.25 in | Wt 181.5 lb

## 2024-01-17 DIAGNOSIS — F411 Generalized anxiety disorder: Secondary | ICD-10-CM | POA: Diagnosis not present

## 2024-01-17 DIAGNOSIS — F331 Major depressive disorder, recurrent, moderate: Secondary | ICD-10-CM | POA: Insufficient documentation

## 2024-01-17 DIAGNOSIS — F410 Panic disorder [episodic paroxysmal anxiety] without agoraphobia: Secondary | ICD-10-CM | POA: Diagnosis not present

## 2024-01-17 MED ORDER — BUSPIRONE HCL 5 MG PO TABS: 5.0000 mg | ORAL_TABLET | Freq: Three times a day (TID) | ORAL | 2 refills | Status: DC | PRN

## 2024-01-17 MED ORDER — ESCITALOPRAM OXALATE 10 MG PO TABS: 10.0000 mg | ORAL_TABLET | Freq: Every day | ORAL | 2 refills | Status: DC

## 2024-01-17 NOTE — Progress Notes (Unsigned)
 Subjective:    Patient ID: Andrea Flowers, female    DOB: [birth year], 23 y.o.   MRN: [id number]  Andrea Flowers is a 23 y.o. female presenting on 01/17/2024 for No chief complaint on file.   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 She is currently being treated for Anxiety. She may return to work on Tuesday 01/17/24. Please excuse her from this absence and she will follow-up with our office for further evaluation and treatment plan.   Health Maintenance: ***     01/17/2024    9:06 AM 09/01/2023    8:16 AM 05/31/2023   10:42 AM  Depression screen PHQ 2/9  Decreased Interest 2 0 0  Down, Depressed, Hopeless 1 0 0  PHQ - 2 Score 3 0 0  Altered sleeping 3 2   Tired, decreased energy 2 0   Change in appetite 3 2   Feeling bad or failure about yourself  2 0   Trouble concentrating 3 0   Moving slowly or fidgety/restless 3 0   Suicidal thoughts 0 0   PHQ-9 Score 19 4   Difficult doing work/chores Very difficult Somewhat difficult        01/17/2024    9:06 AM 09/01/2023    8:16 AM  GAD 7 : Generalized Anxiety Score   Nervous, Anxious, on Edge 3 0  Control/stop worrying 3 0  Worry too much - different things 3 0  Trouble relaxing 3 2  Restless 2 1  Easily annoyed or irritable 1 0  Afraid - awful might happen 2 0  Total GAD 7 Score 17 3  Anxiety Difficulty Very difficult Somewhat difficult    Social History   Tobacco Use   Smoking status: Never   Smokeless tobacco: Never  Vaping Use   Vaping status: Former  Substance Use Topics   Alcohol use: No    Alcohol/week: 0.0 standard drinks of alcohol   Drug use: No    Review of Systems Per HPI unless specifically indicated above     Objective:    BP 122/80 (BP Location: Right Arm, Patient Position: Sitting, Cuff Size: Normal)   Pulse 84   Ht 4' 11.25 (1.505 m)   Wt 181 lb 8 oz (82.3 kg)   SpO2 98%   BMI 36.35 kg/m   Wt Readings from Last 3 Encounters:  01/17/24 181 lb 8 oz (82.3 kg)  09/01/23 175 lb (79.4 kg)  05/31/23 165 lb (74.8 kg)    Physical Exam  Results for orders placed or performed in visit on 08/25/23  HIV Antibody (routine testing  w rflx)   Collection Time: 08/25/23  8:14 AM  Result Value Ref Range   HIV 1&2 Ab, 4th Generation NON-REACTIVE NON-REACTIVE  Hepatitis C antibody   Collection Time: 08/25/23  8:14 AM  Result Value Ref Range   Hepatitis C Ab NON-REACTIVE NON-REACTIVE  T4, free   Collection Time: 08/25/23  8:14 AM  Result Value Ref Range   Free T4 1.2 0.8 - 1.8 ng/dL  TSH   Collection Time: 08/25/23  8:14 AM  Result Value Ref Range   TSH 1.43 mIU/L  CBC with Differential/Platelet   Collection Time: 08/25/23  8:14 AM  Result Value Ref Range   WBC 7.3 3.8 - 10.8 Thousand/uL   RBC 4.78 3.80 - 5.10 Million/uL   Hemoglobin 13.2 11.7 - 15.5 g/dL   HCT 60.0 64.9 - 54.9 %   MCV 83.5 80.0 - 100.0 fL   MCH 27.6 27.0 - 33.0 pg   MCHC 33.1 32.0 - 36.0 g/dL   RDW 86.5 88.9 - 84.9 %   Platelets 324 140 - 400 Thousand/uL   MPV 10.3 7.5 - 12.5 fL   Neutro Abs 4,555 1,500 - 7,800 cells/uL   Absolute  Lymphocytes 2,066 850 - 3,900 cells/uL   Absolute Monocytes 496 200 - 950 cells/uL   Eosinophils Absolute 131 15 - 500 cells/uL   Basophils Absolute 51 0 - 200 cells/uL   Neutrophils Relative % 62.4 %   Total Lymphocyte 28.3 %   Monocytes Relative 6.8 %   Eosinophils Relative 1.8 %   Basophils Relative 0.7 %  COMPLETE METABOLIC PANEL WITH GFR   Collection Time: 08/25/23  8:14 AM  Result Value Ref Range   Glucose, Bld 101 (H) 65 - 99 mg/dL   BUN 12 7 - 25 mg/dL   Creat 9.39 9.49 - 9.03 mg/dL   eGFR 868 > OR = 60 fO/fpw/8.26f7   BUN/Creatinine Ratio SEE NOTE: 6 - 22 (calc)   Sodium 137 135 - 146 mmol/L   Potassium 4.3 3.5 - 5.3 mmol/L   Chloride 105 98 - 110 mmol/L   CO2 23 20 - 32 mmol/L   Calcium 9.6 8.6 - 10.2 mg/dL   Total Protein 7.2 6.1 - 8.1 g/dL   Albumin 4.3 3.6 - 5.1 g/dL   Globulin 2.9 1.9 - 3.7 g/dL (calc)   AG Ratio 1.5 1.0 - 2.5 (calc)   Total Bilirubin 0.4 0.2 - 1.2 mg/dL   Alkaline phosphatase (APISO) 105 31 - 125 U/L   AST 15 10 - 30 U/L   ALT 15 6 - 29 U/L  Hemoglobin A1c   Collection Time: 08/25/23  8:14 AM  Result Value Ref Range   Hgb A1c MFr Bld 5.7 (H) <5.7 % of total Hgb   Mean Plasma Glucose 117 mg/dL   eAG (mmol/L) 6.5 mmol/L      Assessment & Plan:   Problem List Items Addressed This Visit     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   Meds ordered this encounter  Medications   escitalopram (LEXAPRO) 10 MG tablet    Sig: Take 1 tablet (10 mg total) by  mouth daily.    Dispense:  30 tablet    Refill:  2   busPIRone (BUSPAR) 5 MG tablet    Sig: Take 1 tablet (5 mg total) by mouth 3 (three) times  daily as needed (anxiety, panic attack).    Dispense:  90 tablet    Refill:  2    Follow up plan: Return in about 4 weeks (around 02/14/2024) for 4 week follow-up anxiety, mood, treatment.  Future labs ordered for ***  Marsa Officer, DO [facility] 01/17/2024, 9:15 AM

## 2024-01-17 NOTE — Patient Instructions (Addendum)
 Thank you for coming to the office today.  Start Escitalopram 10mg  daily, may take 2-3 weeks for significant benefit for anti depression and anxiety  Start Buspar 5mg  THREE TIMES A DAY as needed for anxiety, can be taken or skipped, or stacked, can do double dose if need, it can help you sleep.  These offices have both PSYCHIATRY doctors and THERAPISTS  [facility] (Virtual Available) [place] [address] [place], [place] [address] Phone: [phone]  [facility] Address: [address], [place], [place] [address] [email or web address] Phone: [phone]  [facility]) Address: [address], [place], [place] [address] Hours: 8:30AM-5PM Phone: [phone]  [facility] (Adult, Peds, Geriatric, Counseling) [address] [place], [place] [address] Phone: [phone] Fax: [phone]  [facility] at [facility] [address] [place], [place] [address] Phone: [phone]  [facility] (All ages) [address], [place], [id number] Phone: [phone] (Option 1) [email or web address]  -------------------------------------------------------------------------- THERAPIST ONLY  (No Psychiatry)  [facility] [address] [place], [place] [address] [place]  P: [phone]  [facility]) [facility], [facility] [address] [place], [place] [address] [phone]  [facility]   Address: [address] [place], [place] [address] Hours: Open today  9AM-7PM Phone: [phone]  [facility] [address], [facility] Address: [address] [place], [place] [address] Phone: [phone]  [facility] [place], [place] [address] Phone: [phone]     Please  schedule a Follow-up Appointment to: Return in about 4 weeks (around 02/14/2024) for 4 week follow-up anxiety, mood, treatment.  If you have any other questions or concerns, please feel free to call the office or send a message through MyChart. You may also schedule an earlier appointment if necessary.  Additionally, you may be receiving a survey about your experience at our office within a few days to 1 week by e-mail or mail. We value your feedback.  Marsa Officer, DO [facility], [place]

## 2024-02-28 ENCOUNTER — Encounter: Payer: Self-pay | Admitting: Family Medicine

## 2024-02-28 ENCOUNTER — Ambulatory Visit: Admitting: Family Medicine

## 2024-02-28 VITALS — BP 122/78 | HR 83 | Ht 59.25 in | Wt 176.2 lb

## 2024-02-28 DIAGNOSIS — F331 Major depressive disorder, recurrent, moderate: Secondary | ICD-10-CM

## 2024-02-28 DIAGNOSIS — F411 Generalized anxiety disorder: Secondary | ICD-10-CM | POA: Diagnosis not present

## 2024-02-28 DIAGNOSIS — F410 Panic disorder [episodic paroxysmal anxiety] without agoraphobia: Secondary | ICD-10-CM | POA: Diagnosis not present

## 2024-02-28 MED ORDER — ESCITALOPRAM OXALATE 10 MG PO TABS: 10.0000 mg | ORAL_TABLET | Freq: Every day | ORAL | 1 refills | Status: AC

## 2024-02-28 MED ORDER — BUSPIRONE HCL 5 MG PO TABS: 5.0000 mg | ORAL_TABLET | Freq: Two times a day (BID) | ORAL | 1 refills | Status: AC | PRN

## 2024-02-28 NOTE — Progress Notes (Signed)
 Subjective:    Patient ID: Andrea Flowers, female    DOB: 01-May-2001, 23 y.o.   MRN: [id number]  Andrea Flowers is a 23 y.o. female presenting on 02/28/2024 for Medical Management of Chronic Issues, Anxiety, Panic Attack, and Depression   HPI  Discussed the use of AI scribe software for clinical note transcription with the patient, who gave verbal consent to proceed.  History of Present Illness   Andrea Flowers is a 23 year old female who presents for follow-up on her anxiety management.  Anxiety and panic symptoms Major Depression  Interval improvement overall Reduced panic attacks She recently took a medical leave 7/1-7/14, has completed, has done well  - Anxiety and panic attacks are ongoing, with current medication regimen providing effective symptom control. - Buspar  5 mg is taken typically twice daily (early morning and afternoon); prescribed for three times daily, but afternoon dose is often skipped unless needed. - Lexapro  10 mg is taken daily with consistent benefit for anxiety management. - Recent increase in life stressors, described as 'a lot of stuff going on,' may be contributing to anxiety levels. - In process of setting up therapy through union representative to find a provider covered by insurance. - Active communication with union representative to facilitate therapy initiation.  Union rep working arranging apt with covered in network therapist Seems to have persistent mood and anxiety symptoms now Comfortable with current doses      02/28/2024    8:20 AM 01/17/2024    9:06 AM 09/01/2023    8:16 AM  Depression screen PHQ 2/9  Decreased Interest 2 2 0  Down, Depressed, Hopeless 2 1 0  PHQ - 2 Score 4 3 0  Altered sleeping 3 3 2   Tired, decreased energy 2 2 0  Change in appetite 2 3 2   Feeling bad or failure about yourself  1 2 0  Trouble concentrating 2 3 0  Moving slowly or fidgety/restless 2 3 0  Suicidal thoughts 1 0 0  PHQ-9 Score 17 19 4    Difficult doing work/chores Somewhat difficult Very difficult Somewhat difficult   [place]-Suicide Severity Rating Scale 1) Have you wished you were dead or wished you could go to sleep and not wake up? - Yes  2) Have you had any actual thoughts of killing yourself? - No  Skip questions 3,4, 5  6) Have you ever done anything, started to do anything, or prepared to do anything to end your life? - No      02/28/2024    8:20 AM 01/17/2024    9:06 AM 09/01/2023    8:16 AM  GAD 7 : Generalized Anxiety Score  Nervous, Anxious, on Edge 2 3 0  Control/stop worrying 2 3 0  Worry too much - different things 2 3 0  Trouble relaxing 2 3 2   Restless 1 2 1   Easily annoyed or irritable 1 1 0  Afraid - awful might happen 1 2 0  Total GAD 7 Score 11 17 3   Anxiety Difficulty Somewhat difficult Very difficult Somewhat difficult    Social History   Tobacco Use   Smoking status: Never   Smokeless tobacco: Never  Vaping Use   Vaping status: Former  Substance Use Topics   Alcohol use: No    Alcohol/week: 0.0 standard drinks of alcohol   Drug use: No    Review of Systems Per HPI unless specifically indicated above     Objective:    BP 122/78 (BP  Location: Right Arm, Patient Position: Sitting, Cuff Size: Normal)   Pulse 83   Ht 4' 11.25 (1.505 m)   Wt 176 lb 4 oz (79.9 kg)   SpO2 98%   BMI 35.30 kg/m   Wt Readings from Last 3 Encounters:  02/28/24 176 lb 4 oz (79.9 kg)  01/17/24 181 lb 8 oz (82.3 kg)  09/01/23 175 lb (79.4 kg)    Physical Exam Vitals and nursing note reviewed.  Constitutional:      General: She is not in acute distress.    Appearance: Normal appearance. She is well-developed. She is not diaphoretic.     Comments: Well-appearing, comfortable, cooperative  HENT:     Head: Normocephalic and atraumatic.  Eyes:     General:        Right eye: No discharge.        Left eye: No discharge.     Conjunctiva/sclera: Conjunctivae normal.  Cardiovascular:     Rate  and Rhythm: Normal rate.  Pulmonary:     Effort: Pulmonary effort is normal.  Skin:    General: Skin is warm and dry.     Findings: No erythema or rash.  Neurological:     Mental Status: She is alert and oriented to person, place, and time.  Psychiatric:        Mood and Affect: Mood normal.        Behavior: Behavior normal.        Thought Content: Thought content normal.     Comments: Well groomed, good eye contact, normal speech and thoughts     Results for orders placed or performed in visit on 08/25/23  HIV Antibody (routine testing w rflx)   Collection Time: 08/25/23  8:14 AM  Result Value Ref Range   HIV 1&2 Ab, 4th Generation NON-REACTIVE NON-REACTIVE  Hepatitis C antibody   Collection Time: 08/25/23  8:14 AM  Result Value Ref Range   Hepatitis C Ab NON-REACTIVE NON-REACTIVE  T4, free   Collection Time: 08/25/23  8:14 AM  Result Value Ref Range   Free T4 1.2 0.8 - 1.8 ng/dL  TSH   Collection Time: 08/25/23  8:14 AM  Result Value Ref Range   TSH 1.43 mIU/L  CBC with Differential/Platelet   Collection Time: 08/25/23  8:14 AM  Result Value Ref Range   WBC 7.3 3.8 - 10.8 Thousand/uL   RBC 4.78 3.80 - 5.10 Million/uL   Hemoglobin 13.2 11.7 - 15.5 g/dL   HCT 60.0 64.9 - 54.9 %   MCV 83.5 80.0 - 100.0 fL   MCH 27.6 27.0 - 33.0 pg   MCHC 33.1 32.0 - 36.0 g/dL   RDW 86.5 88.9 - 84.9 %   Platelets 324 140 - 400 Thousand/uL   MPV 10.3 7.5 - 12.5 fL   Neutro Abs 4,555 1,500 - 7,800 cells/uL   Absolute Lymphocytes 2,066 850 - 3,900 cells/uL   Absolute Monocytes 496 200 - 950 cells/uL   Eosinophils Absolute 131 15 - 500 cells/uL   Basophils Absolute 51 0 - 200 cells/uL   Neutrophils Relative % 62.4 %   Total Lymphocyte 28.3 %   Monocytes Relative 6.8 %   Eosinophils Relative 1.8 %   Basophils Relative 0.7 %  COMPLETE METABOLIC PANEL WITH GFR   Collection Time: 08/25/23  8:14 AM  Result Value Ref Range   Glucose, Bld 101 (H) 65 - 99 mg/dL   BUN 12 7 - 25 mg/dL    Creat 9.39 9.49 - 9.03  mg/dL   eGFR 868 > OR = 60 fO/fpw/8.26f7   BUN/Creatinine Ratio SEE NOTE: 6 - 22 (calc)   Sodium 137 135 - 146 mmol/L   Potassium 4.3 3.5 - 5.3 mmol/L   Chloride 105 98 - 110 mmol/L   CO2 23 20 - 32 mmol/L   Calcium 9.6 8.6 - 10.2 mg/dL   Total Protein 7.2 6.1 - 8.1 g/dL   Albumin 4.3 3.6 - 5.1 g/dL   Globulin 2.9 1.9 - 3.7 g/dL (calc)   AG Ratio 1.5 1.0 - 2.5 (calc)   Total Bilirubin 0.4 0.2 - 1.2 mg/dL   Alkaline phosphatase (APISO) 105 31 - 125 U/L   AST 15 10 - 30 U/L   ALT 15 6 - 29 U/L  Hemoglobin A1c   Collection Time: 08/25/23  8:14 AM  Result Value Ref Range   Hgb A1c MFr Bld 5.7 (H) <5.7 % of total Hgb   Mean Plasma Glucose 117 mg/dL   eAG (mmol/L) 6.5 mmol/L      Assessment & Plan:   Problem List Items Addressed This Visit     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 Buspar  controls panic attacks. Lexapro  taken consistently. Anxiety scores improved, mood scores high. Exploring therapy options through insurance. - Continue Buspar  5 mg twice daily with optional additional dose. - Continue Lexapro  10 mg daily. - Change Buspar  prescription to 90-day supply with one refill. - Change Lexapro  prescription to 90-day supply with one refill. - She will coordinate with employer union representative to set up therapy through insurance. - Document therapy setup once established.     No orders of the defined types were placed in this encounter.   Meds ordered this encounter  Medications   escitalopram  (LEXAPRO ) 10 MG tablet    Sig: Take 1 tablet (10 mg total) by mouth daily.     Dispense:  90 tablet    Refill:  1    Update to 90 day   busPIRone  (BUSPAR ) 5 MG tablet    Sig: Take 1 tablet (5 mg total) by mouth 2 (two) times daily as needed (anxiety, panic attack).    Dispense:  180 tablet    Refill:  1    Update to 90 day supply    Follow up plan: Return if symptoms worsen or fail to improve.  Marsa Officer, DO [facility] 02/28/2024, 8:45 AM

## 2024-02-28 NOTE — Patient Instructions (Addendum)
 Thank you for coming to the office today.  Refilled both meds for 90 day supply keep on Buspar  twice a day and add 3rd dose if need Keep on Lexapro  10mg  daily  Continue with union rep to arrange therapist apt.  Keep apt in Feb 2026 for labs and physical  Follow up sooner if any new concern or worsening   Please schedule a Follow-up Appointment to: Return if symptoms worsen or fail to improve.  If you have any other questions or concerns, please feel free to call the office or send a message through MyChart. You may also schedule an earlier appointment if necessary.  Additionally, you may be receiving a survey about your experience at our office within a few days to 1 week by e-mail or mail. We value your feedback.  Marsa Officer, DO [facility], [place]

## 2024-05-11 ENCOUNTER — Ambulatory Visit
Admission: EM | Admit: 2024-05-11 | Discharge: 2024-05-11 | Disposition: A | Discharge status: Home / Self Care | Attending: Physician Assistant | Admitting: Physician Assistant

## 2024-05-11 DIAGNOSIS — H538 Other visual disturbances: Secondary | ICD-10-CM

## 2024-05-11 DIAGNOSIS — R42 Dizziness and giddiness: Secondary | ICD-10-CM

## 2024-05-11 DIAGNOSIS — R110 Nausea: Secondary | ICD-10-CM | POA: Diagnosis not present

## 2024-05-11 DIAGNOSIS — H53149 Visual discomfort, unspecified: Secondary | ICD-10-CM | POA: Diagnosis not present

## 2024-05-11 DIAGNOSIS — G43809 Other migraine, not intractable, without status migrainosus: Secondary | ICD-10-CM

## 2024-05-11 MED ORDER — SUMATRIPTAN SUCCINATE 6 MG/0.5ML ~~LOC~~ SOLN
6.0000 mg | Freq: Once | SUBCUTANEOUS | Status: AC
  Administered 2024-05-11: 6 mg via SUBCUTANEOUS

## 2024-05-11 MED ORDER — KETOROLAC TROMETHAMINE 60 MG/2ML IM SOLN
60.0000 mg | Freq: Once | INTRAMUSCULAR | Status: AC
  Administered 2024-05-11: 60 mg via INTRAMUSCULAR

## 2024-05-11 MED ORDER — KETOROLAC TROMETHAMINE 10 MG PO TABS: 10.0000 mg | ORAL_TABLET | Freq: Four times a day (QID) | ORAL | 0 refills | Status: DC | PRN

## 2024-05-11 MED ORDER — PROMETHAZINE HCL 25 MG PO TABS: 25.0000 mg | ORAL_TABLET | Freq: Four times a day (QID) | ORAL | 0 refills | Status: AC | PRN

## 2024-05-11 MED ORDER — ONDANSETRON HCL 4 MG/2ML IJ SOLN
8.0000 mg | Freq: Once | INTRAMUSCULAR | Status: AC
  Administered 2024-05-11: 8 mg via INTRAMUSCULAR

## 2024-05-11 MED ORDER — SUMATRIPTAN SUCCINATE 50 MG PO TABS: 50.0000 mg | ORAL_TABLET | ORAL | 0 refills | Status: DC | PRN

## 2024-05-11 NOTE — ED Provider Notes (Signed)
 [facility]    CSN: [id number] Arrival date & time: 05/11/24  1545      History   Chief Complaint Chief Complaint  Patient presents with   Migraine    HPI Andrea Flowers is a 23 y.o. female with history of migraines, asthma and anxiety.  She presents with her partner today for left-sided migraine headache with associated photophobia, blurred vision in the left eye, nausea and slight dizziness.  Symptoms have been ongoing since yesterday but worsened today.  Reports no associated vomiting, numbness/tingling or facial weakness/drooping.  No slurred speech or gait instability.  Reports history of similar migraines in the past.  States she once had double vision/blurry vision in both eyes and had an MRI done at that time.  MRI was normal.  Reports it took a couple weeks for her to regain normal vision.  Reports intermittent migraine headaches since then but they typically have not affected vision.  Denies contacts or glasses and states she normally has great vision.  She has taken ibuprofen  and Tylenol yesterday but no medication today.  Not on any prophylactic migraine medications. No falls or trauma. Previously seen by [facility] neurology in 2023.  HPI  Past Medical History:  Diagnosis Date   Asthma    Syncope     Patient Active Problem List   Diagnosis Date Noted   Moderate episode of recurrent major depressive disorder (HCC) 01/17/2024   Acute anxiety 03/07/2015   Asthma, mild persistent 03/07/2015   Generalized anxiety disorder with panic attacks 03/07/2015    Past Surgical History:  Procedure Laterality Date   NO PAST SURGERIES      OB History   No obstetric history on file.      Home Medications    Prior to Admission medications   Medication Sig Start Date End Date Taking? Authorizing Provider  busPIRone  (BUSPAR ) 5 MG tablet Take 1 tablet (5 mg total) by mouth 2 (two) times daily as needed (anxiety, panic attack). 02/28/24  Yes Karamalegos, Marsa PARAS,  DO  escitalopram  (LEXAPRO ) 10 MG tablet Take 1 tablet (10 mg total) by mouth daily. 02/28/24  Yes Karamalegos, Marsa PARAS, DO  ketorolac (TORADOL) 10 MG tablet Take 1 tablet (10 mg total) by mouth every 6 (six) hours as needed. 05/11/24  Yes Arvis Jolan NOVAK, PA-C  Multiple Vitamins-Minerals (WOMENS MULTIVITAMIN PO) Take by mouth.   Yes [provider]  promethazine  (PHENERGAN ) 25 MG tablet Take 1 tablet (25 mg total) by mouth every 6 (six) hours as needed for nausea or vomiting. 05/11/24  Yes Arvis Jolan B, PA-C  SUMAtriptan (IMITREX) 50 MG tablet Take 1 tablet (50 mg total) by mouth every 2 (two) hours as needed for migraine. May repeat in 2 hours if headache persists or recurs. 05/11/24  Yes Arvis Jolan B, PA-C  budesonide -formoterol  (SYMBICORT ) 80-4.5 MCG/ACT inhaler Inhale 2 puffs into the lungs 2 (two) times daily. 04/06/16 06/19/20  Shirl Greig Maxwell, NP    Family History Family History  Problem Relation Age of Onset   Thyroid disease Mother    Diabetes Father    Thyroid disease Maternal Grandmother    Breast cancer Maternal Grandmother    Colon cancer Maternal Grandfather 17       UNSURE EXACT AGE. Treated.   Diabetes Paternal Grandmother    Diabetes Paternal Grandfather    Prostate cancer Paternal Grandfather    Thyroid disease Maternal Aunt     Social History Social History   Tobacco Use  Smoking status: Never   Smokeless tobacco: Never  Vaping Use   Vaping status: Former  Substance Use Topics   Alcohol use: No    Alcohol/week: 0.0 standard drinks of alcohol   Drug use: No     Allergies   Patient has no known allergies.   Review of Systems Review of Systems  Constitutional:  Negative for fatigue.  Eyes:  Positive for photophobia and visual disturbance. Negative for pain and redness.  Respiratory:  Negative for shortness of breath.   Cardiovascular:  Negative for chest pain and palpitations.  Gastrointestinal:  Positive for nausea. Negative for  abdominal pain and vomiting.  Musculoskeletal:  Negative for neck pain and neck stiffness.  Neurological:  Positive for dizziness and headaches. Negative for tremors, seizures, syncope, facial asymmetry, speech difficulty, weakness, light-headedness and numbness.  Psychiatric/Behavioral:  Negative for confusion.      Physical Exam Triage Vital Signs ED Triage Vitals  Encounter Vitals Group     BP 05/11/24 1639 130/76     Girls Systolic BP Percentile --      Girls Diastolic BP Percentile --      Boys Systolic BP Percentile --      Boys Diastolic BP Percentile --      Pulse Rate 05/11/24 1639 73     Resp --      Temp 05/11/24 1639 98.2 F (36.8 C)     Temp Source 05/11/24 1639 Oral     SpO2 05/11/24 1639 100 %     Weight 05/11/24 1641 178 lb 6.4 oz (80.9 kg)     Height --      Head Circumference --      Peak Flow --      Pain Score 05/11/24 1641 7     Pain Loc --      Pain Education --      Exclude from Growth Chart --    No data found.  Updated Vital Signs BP 119/78 (BP Location: Left Arm)   Pulse 65   Temp 98.2 F (36.8 C) (Oral)   Wt 178 lb 6.4 oz (80.9 kg)   LMP 05/08/2024   SpO2 100%   BMI 35.73 kg/m  (Pt does not wear contacts or glasses)  Physical Exam Vitals and nursing note reviewed.  Constitutional:      General: She is not in acute distress.    Appearance: Normal appearance. She is not ill-appearing or toxic-appearing.  HENT:     Head: Normocephalic and atraumatic.     Nose: Nose normal.     Mouth/Throat:     Mouth: Mucous membranes are moist.     Pharynx: Oropharynx is clear.  Eyes:     General: Gaze aligned appropriately. Visual field deficit (left eye visual loss) present. No scleral icterus.       Right eye: No discharge.        Left eye: No discharge.     Extraocular Movements: Extraocular movements intact.     Conjunctiva/sclera: Conjunctivae normal.     Pupils: Pupils are equal, round, and reactive to light.  Cardiovascular:     Rate and  Rhythm: Normal rate and regular rhythm.     Heart sounds: Normal heart sounds.  Pulmonary:     Effort: Pulmonary effort is normal. No respiratory distress.     Breath sounds: Normal breath sounds.  Musculoskeletal:     Cervical back: Neck supple.  Skin:    General: Skin is dry.  Neurological:  General: No focal deficit present.     Mental Status: She is alert and oriented to person, place, and time. Mental status is at baseline.     Cranial Nerves: No cranial nerve deficit.     Motor: No weakness.     Coordination: Coordination normal.     Gait: Gait normal.     Comments: 5/5 strength bilateral upper and lower extremities   Psychiatric:        Mood and Affect: Mood normal.        Behavior: Behavior normal.        Thought Content: Thought content normal.      UC Treatments / Results  Labs (all labs ordered are listed, but only abnormal results are displayed) Labs Reviewed - No data to display  EKG   Radiology No results found. Study Result  Narrative & Impression  CLINICAL DATA:  Migraine and blurry vision   EXAM: MRI HEAD WITHOUT AND WITH CONTRAST   TECHNIQUE: Multiplanar, multiecho pulse sequences of the brain and surrounding structures were obtained without and with intravenous contrast.   CONTRAST:  5mL GADAVIST  GADOBUTROL  1 MMOL/ML IV SOLN   COMPARISON:  None.   FINDINGS: Brain: No acute infarct, mass effect or extra-axial collection. No acute or chronic hemorrhage. Normal white matter signal, parenchymal volume and CSF spaces. The midline structures are normal. There is no abnormal contrast enhancement.   Vascular: Major flow voids are preserved.   Skull and upper cervical spine: Normal calvarium and skull base. Visualized upper cervical spine and soft tissues are normal.   Sinuses/Orbits:No paranasal sinus fluid levels or advanced mucosal thickening. No mastoid or middle ear effusion. Normal orbits.   IMPRESSION: Normal brain MRI.      Electronically Signed   By: Franky Stanford M.D.   On: 10/22/2021 01:11   Procedures Procedures (including critical care time)  Medications Ordered in UC Medications  ketorolac (TORADOL) injection 60 mg (60 mg Intramuscular Given 05/11/24 1715)  SUMAtriptan (IMITREX) injection 6 mg (6 mg Subcutaneous Given 05/11/24 1714)  ondansetron  (ZOFRAN ) injection 8 mg (8 mg Intramuscular Given 05/11/24 1713)    Initial Impression / Assessment and Plan / UC Course  I have reviewed the triage vital signs and the nursing notes.  Pertinent labs & imaging results that were available during my care of the patient were reviewed by me and considered in my medical decision making (see chart for details).   23 year old female with history of migraine headaches, anxiety/depression, and asthma presents for onset of left-sided migraine headache with photophobia, blurred vision of the left eye, nausea without vomiting and slight dizziness yesterday.  Symptoms got worse today.  Denies fall or head trauma/injury.  Similar migraines in the past.  Similar migraine in 2023.  At that time she had a normal brain MRI which I included above.  No meds taken today.  Vitals are all stable and normal and she is overall well-appearing.  Other than loss of vision in left eye and photophobia in both eyes, exam is normal.  Normal cranial nerve exam.  5 out of 5 strength bilateral upper and lower extremities.  Chest clear.  Heart regular rate rhythm.  Patient given 60 mg IM ketorolac, 6 mg subcu Imitrex and 8 mg IM Zofran  for acute migraine relief.  Patient will be kept and monitored for period of time.  Patient reported 50% relief of migraine headache 1.5 hours after administration of meds.  Patient ready to go home.  Still reports blurry vision  in the left eye.  We discussed the importance of following up with neurology and even ophthalmology if visual deficits do not improve immediately.  Sent prescription for Imitrex,  promethazine  and ketorolac to pharmacy.  Advised avoiding triggers.  Discussed resting in a dark room.   Patient advised of the following  - I sent more medication to pharmacy in case your migraine is not completely gone tomorrow. - If migraine is just as bad tomorrow as it is now go to the ER.  If symptoms are not gone in the next 24 hours go to the ER. - Follow-up with your neurologist especially if the vision is still a problem. - If any acute worsening of your symptoms, weakness or numbness to face, difficulty with speech or balance, increased dizziness, feeling faint or passing out, chest pain, breathing difficulty, weakness, etc., call 911 or go to ER.    Final Clinical Impressions(s) / UC Diagnoses   Final diagnoses:  Other migraine without status migrainosus, not intractable  Blurred vision, left eye  Dizziness  Photophobia  Nausea without vomiting     Discharge Instructions      - Migraine treated in the clinic today with Toradol, Imitrex and Zofran . - I sent more medication to pharmacy in case your migraine is not completely gone tomorrow. - If migraine is just as bad tomorrow as it is now go to the ER.  If symptoms are not gone in the next 24 hours go to the ER. - Follow-up with your neurologist especially if the vision is still a problem. - If any acute worsening of your symptoms, weakness or numbness to face, difficulty with speech or balance, increased dizziness, feeling faint or passing out, chest pain, breathing difficulty, weakness, etc., call 911 or go to ER.     ED Prescriptions     Medication Sig Dispense Auth. Provider   SUMAtriptan (IMITREX) 50 MG tablet Take 1 tablet (50 mg total) by mouth every 2 (two) hours as needed for migraine. May repeat in 2 hours if headache persists or recurs. 10 tablet Arvis Huxley B, PA-C   promethazine  (PHENERGAN ) 25 MG tablet Take 1 tablet (25 mg total) by mouth every 6 (six) hours as needed for nausea or vomiting. 30 tablet  Arvis Huxley B, PA-C   ketorolac (TORADOL) 10 MG tablet Take 1 tablet (10 mg total) by mouth every 6 (six) hours as needed. 20 tablet Yatziry Deakins B, PA-C      PDMP not reviewed this encounter.   Arvis Huxley NOVAK, PA-C 05/11/24 [phone]

## 2024-05-11 NOTE — Discharge Instructions (Addendum)
-   Migraine treated in the clinic today with Toradol, Imitrex and Zofran . - I sent more medication to pharmacy in case your migraine is not completely gone tomorrow. - If migraine is just as bad tomorrow as it is now go to the ER.  If symptoms are not gone in the next 24 hours go to the ER. - Follow-up with your neurologist especially if the vision is still a problem. - If any acute worsening of your symptoms, weakness or numbness to face, difficulty with speech or balance, increased dizziness, feeling faint or passing out, chest pain, breathing difficulty, weakness, etc., call 911 or go to ER.

## 2024-05-11 NOTE — ED Triage Notes (Signed)
 Pt is with her wife  Pt c/o migraine and pain behind the left eye  Pt states that she can not see from her left eye  Pt states that she is having left side head pain and tenderness when she touches her left eye and upper cheek.

## 2024-05-14 ENCOUNTER — Ambulatory Visit: Admitting: Family Medicine

## 2024-05-14 ENCOUNTER — Ambulatory Visit: Payer: Self-pay

## 2024-05-14 ENCOUNTER — Ambulatory Visit: Admitting: Internal Medicine

## 2024-05-14 ENCOUNTER — Encounter: Payer: Self-pay | Admitting: Family Medicine

## 2024-05-14 VITALS — BP 120/70 | HR 87 | Ht 59.25 in | Wt 180.0 lb

## 2024-05-14 DIAGNOSIS — H5462 Unqualified visual loss, left eye, normal vision right eye: Secondary | ICD-10-CM | POA: Diagnosis not present

## 2024-05-14 DIAGNOSIS — G43109 Migraine with aura, not intractable, without status migrainosus: Secondary | ICD-10-CM | POA: Diagnosis not present

## 2024-05-14 DIAGNOSIS — H532 Diplopia: Secondary | ICD-10-CM | POA: Diagnosis not present

## 2024-05-14 MED ORDER — RIZATRIPTAN BENZOATE 10 MG PO TBDP: 10.0000 mg | ORAL_TABLET | ORAL | 2 refills | Status: AC | PRN

## 2024-05-14 NOTE — Progress Notes (Deleted)
 Duplicate

## 2024-05-14 NOTE — Telephone Encounter (Signed)
 FYI Only or Action Required?: FYI only for provider.  Patient was last seen in primary care on 02/28/2024 by Edman Marsa PARAS, DO.  Called Nurse Triage reporting Headache.  Symptoms began a week ago.  Interventions attempted: Other: Urgent care yesterday and treated with toradol injection.  Symptoms are: gradually worsening.  Triage Disposition: See Physician Within 24 Hours  Patient/caregiver understands and will follow disposition?: Yes Copied from CRM #[id number]. Topic: Clinical - Red Word Triage >> May 14, 2024  9:24 AM Montie POUR wrote: Red Word that prompted transfer to Nurse Triage:  Headache, Pain in left eye and it affects vision, Pain level 6-7 - This has been going on for about 1 week. Reason for Disposition  [1] MODERATE headache (e.g., interferes with normal activities) AND [2] present > 24 hours AND [3] unexplained  (Exceptions: Pain medicines not tried, typical migraine, or headache part of viral illness.)  Answer Assessment - Initial Assessment Questions 1. LOCATION: Where does it hurt?      Behind left eye  2. ONSET: When did the headache start? (e.g., minutes, hours, days)      1 week ago  3. PATTERN: Does the pain come and go, or has it been constant since it started?     Constant  4. SEVERITY: How bad is the pain? and What does it keep you from doing?  (e.g., Scale 1-10; mild, moderate, or severe)     7/10 pain  5. RECURRENT SYMPTOM: Have you ever had headaches before? If Yes, ask: When was the last time? and What happened that time?      *No Answer* 6. CAUSE: What do you think is causing the headache?     Thinks it may be a migraine again  7. MIGRAINE: Have you been diagnosed with migraine headaches? If Yes, ask: Is this headache similar?      Not formally diagnosed by PCP but was told by an UC doctor several years ago that she was having a migraine   8. HEAD INJURY: Has there been any recent injury to your head?       No  9. OTHER SYMPTOMS: Do you have any other symptoms? (e.g., fever, stiff neck, eye pain, sore throat, cold symptoms)     Eye pain, vision changes  10. PREGNANCY: Is there any chance you are pregnant? When was your last menstrual period?       No- LMP last month  Protocols used: Headache-A-AH

## 2024-05-14 NOTE — Progress Notes (Signed)
 Subjective:    Patient ID: Andrea Flowers, female    DOB: 06-30-2001, 23 y.o.   MRN: [id number]  Andrea Flowers is a 23 y.o. female presenting on 05/14/2024 for Migraine  Patient presents for a same day appointment.  HPI  Discussed the use of AI scribe software for clinical note transcription with the patient, who gave verbal consent to proceed.  History of Present Illness   Andrea Flowers is a 23 year old female with migraines who presents with a severe migraine and left eye vision loss.  Complicated Migraine, with diplopia vision loss Left Eye Cephalalgia and associated symptoms - Severe migraine onset Friday 10/24, prompting early departure from work and urgent care visit at [facility]. - Headache is pressure-like, primarily located behind the left eye - Pain is sharp, worsened with eye movement and bright lights - No identified migraine triggers - No associated loss of sensation, speech difficulties, or motor deficits - No chest pain, shortness of breath, or palpitations  Visual disturbance - Vision loss in the left eye concurrent with headache onset - No recent evaluation by an eye doctor, but prior assessment in 2023 by an eye specialist found no abnormalities - Similar episode approximately 2.5 years ago affecting both eyes, with symptoms lasting about two weeks  Prior episodes and diagnostic evaluation - Similar episode 2.5 years ago with bilateral eye involvement - MRI of the brain at that time was normal  Therapeutic interventions and response - At urgent care, received three injections for nausea, pain, and migraine relief with temporary improvement for a few hours - Prescribed sumatriptan for migraines and promethazine  for nausea, with only temporary relief for a couple of hours over the weekend - Uses ibuprofen  as needed - Has not tried newer migraine medications prior to this episode  Occupational and environmental factors - 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      02/28/2024    8:20 AM 01/17/2024    9:06 AM 09/01/2023    8:16 AM  Depression screen PHQ 2/9  Decreased Interest 2 2 0  Down, Depressed, Hopeless 2 1 0  PHQ - 2 Score 4 3 0  Altered sleeping 3 3 2   Tired, decreased energy 2 2 0  Change in appetite 2 3 2   Feeling bad or failure about yourself  1 2 0  Trouble concentrating 2 3 0  Moving slowly or fidgety/restless 2 3 0  Suicidal thoughts 1 0 0  PHQ-9 Score 17 19 4   Difficult doing work/chores Somewhat difficult Very difficult Somewhat difficult       02/28/2024    8:20 AM 01/17/2024    9:06 AM 09/01/2023    8:16 AM  GAD 7 : Generalized Anxiety Score  Nervous, Anxious, on Edge 2 3 0  Control/stop worrying 2 3 0  Worry too much - different things 2 3 0  Trouble relaxing 2 3 2   Restless 1 2 1   Easily annoyed or irritable 1 1 0  Afraid - awful might happen 1 2 0  Total GAD 7 Score 11 17 3   Anxiety Difficulty Somewhat difficult Very difficult Somewhat difficult    Social History   Tobacco Use   Smoking status: Never   Smokeless tobacco: Never  Vaping Use   Vaping status: Former  Substance Use Topics   Alcohol use: No    Alcohol/week: 0.0 standard drinks of alcohol   Drug use:  No    Review of Systems Per HPI unless specifically indicated above     Objective:    BP 120/70 (BP Location: Right Arm, Patient Position: Sitting, Cuff Size: Normal)   Pulse 87   Ht 4' 11.25 (1.505 m)   Wt 180 lb (81.6 kg)   LMP 05/08/2024   SpO2 99%   BMI 36.05 kg/m   Wt Readings from Last 3 Encounters:  05/14/24 180 lb (81.6 kg)  05/11/24 178 lb 6.4 oz (80.9 kg)  02/28/24 176 lb 4 oz (79.9 kg)    Physical Exam Vitals and nursing note reviewed.  Constitutional:      General: She is not in acute distress.    Appearance: Normal appearance. She is well-developed. She is not diaphoretic.     Comments: Well-appearing, comfortable, cooperative  HENT:      Head: Normocephalic and atraumatic.  Eyes:     General:        Right eye: No discharge.        Left eye: No discharge.     Extraocular Movements: Extraocular movements intact.     Conjunctiva/sclera: Conjunctivae normal.     Pupils: Pupils are equal, round, and reactive to light.     Comments: Triggered diplopia with following light in visual field  Cardiovascular:     Rate and Rhythm: Normal rate.  Pulmonary:     Effort: Pulmonary effort is normal.  Skin:    General: Skin is warm and dry.     Findings: No erythema or rash.  Neurological:     General: No focal deficit present.     Mental Status: She is alert and oriented to person, place, and time. Mental status is at baseline.     Cranial Nerves: No cranial nerve deficit.     Sensory: No sensory deficit.     Motor: No weakness.  Psychiatric:        Mood and Affect: Mood normal.        Behavior: Behavior normal.        Thought Content: Thought content normal.     Comments: Well groomed, good eye contact, normal speech and thoughts     I have personally reviewed the radiology report from 10/21/21 on MRI Brain.  CLINICAL DATA:  Migraine and blurry vision   EXAM: MRI HEAD WITHOUT AND WITH CONTRAST   TECHNIQUE: Multiplanar, multiecho pulse sequences of the brain and surrounding structures were obtained without and with intravenous contrast.   CONTRAST:  5mL GADAVIST  GADOBUTROL  1 MMOL/ML IV SOLN   COMPARISON:  None.   FINDINGS: Brain: No acute infarct, mass effect or extra-axial collection. No acute or chronic hemorrhage. Normal white matter signal, parenchymal volume and CSF spaces. The midline structures are normal. There is no abnormal contrast enhancement.   Vascular: Major flow voids are preserved.   Skull and upper cervical spine: Normal calvarium and skull base. Visualized upper cervical spine and soft tissues are normal.   Sinuses/Orbits:No paranasal sinus fluid levels or advanced mucosal thickening. No  mastoid or middle ear effusion. Normal orbits.   IMPRESSION: Normal brain MRI.     Electronically Signed   By: Franky Stanford M.D.   On: 10/22/2021 01:11  Results for orders placed or performed in visit on 08/25/23  HIV Antibody (routine testing w rflx)   Collection Time: 08/25/23  8:14 AM  Result Value Ref Range   HIV 1&2 Ab, 4th Generation NON-REACTIVE NON-REACTIVE  Hepatitis C antibody   Collection Time: 08/25/23  8:14  AM  Result Value Ref Range   Hepatitis C Ab NON-REACTIVE NON-REACTIVE  T4, free   Collection Time: 08/25/23  8:14 AM  Result Value Ref Range   Free T4 1.2 0.8 - 1.8 ng/dL  TSH   Collection Time: 08/25/23  8:14 AM  Result Value Ref Range   TSH 1.43 mIU/L  CBC with Differential/Platelet   Collection Time: 08/25/23  8:14 AM  Result Value Ref Range   WBC 7.3 3.8 - 10.8 Thousand/uL   RBC 4.78 3.80 - 5.10 Million/uL   Hemoglobin 13.2 11.7 - 15.5 g/dL   HCT 60.0 64.9 - 54.9 %   MCV 83.5 80.0 - 100.0 fL   MCH 27.6 27.0 - 33.0 pg   MCHC 33.1 32.0 - 36.0 g/dL   RDW 86.5 88.9 - 84.9 %   Platelets 324 140 - 400 Thousand/uL   MPV 10.3 7.5 - 12.5 fL   Neutro Abs 4,555 1,500 - 7,800 cells/uL   Absolute Lymphocytes 2,066 850 - 3,900 cells/uL   Absolute Monocytes 496 200 - 950 cells/uL   Eosinophils Absolute 131 15 - 500 cells/uL   Basophils Absolute 51 0 - 200 cells/uL   Neutrophils Relative % 62.4 %   Total Lymphocyte 28.3 %   Monocytes Relative 6.8 %   Eosinophils Relative 1.8 %   Basophils Relative 0.7 %  COMPLETE METABOLIC PANEL WITH GFR   Collection Time: 08/25/23  8:14 AM  Result Value Ref Range   Glucose, Bld 101 (H) 65 - 99 mg/dL   BUN 12 7 - 25 mg/dL   Creat 9.39 9.49 - 9.03 mg/dL   eGFR 868 > OR = 60 fO/fpw/8.26f7   BUN/Creatinine Ratio SEE NOTE: 6 - 22 (calc)   Sodium 137 135 - 146 mmol/L   Potassium 4.3 3.5 - 5.3 mmol/L   Chloride 105 98 - 110 mmol/L   CO2 23 20 - 32 mmol/L   Calcium 9.6 8.6 - 10.2 mg/dL   Total Protein 7.2 6.1 - 8.1 g/dL    Albumin 4.3 3.6 - 5.1 g/dL   Globulin 2.9 1.9 - 3.7 g/dL (calc)   AG Ratio 1.5 1.0 - 2.5 (calc)   Total Bilirubin 0.4 0.2 - 1.2 mg/dL   Alkaline phosphatase (APISO) 105 31 - 125 U/L   AST 15 10 - 30 U/L   ALT 15 6 - 29 U/L  Hemoglobin A1c   Collection Time: 08/25/23  8:14 AM  Result Value Ref Range   Hgb A1c MFr Bld 5.7 (H) <5.7 % of total Hgb   Mean Plasma Glucose 117 mg/dL   eAG (mmol/L) 6.5 mmol/L      Assessment & Plan:   Problem List Items Addressed This Visit   None Visit Diagnoses       Complicated migraine    -  Primary   Relevant Medications   rizatriptan (MAXALT-MLT) 10 MG disintegrating tablet   Other Relevant Orders   Ambulatory referral to Neurology     Monocular diplopia of left eye       Relevant Orders   Ambulatory referral to Neurology       Complicated Migraine with vision loss Migraine with left eye vision loss, similar to previous episode in 2023, with Neurology work up and MRI previously normal as well as ophthalmology evaluation.  Current treatment provides temporary relief. Discussed newer medications like Ubrelvy for breaking migraine cycle and restoring vision.  Advised follow-up with neurologist / referral  - Provide Ubrelvy samples (50 mg and 100 mg).  Instruct to take 50 mg at headache onset, repeat after two hours if needed, max 100 mg/day. - Order new generic migraine medication Rizatriptan (Maxalt) 10mg  repeat within 2 hours for max dose - Discontinue sumatriptan / Imitrex. -  Limit Toradol use as NSAID can use ibuprofen   - Refer to neurologist for further evaluation and management. Encourage contacting neurologist to expedite appointment. - Provide work note for two-week leave via Short Term Disability due to migraine severity and vision loss, unable to work at this time.  Follow-up Requires follow-up to assess new migraine treatment effectiveness and work readiness. Coordination with neurologist needed for ongoing management. Discussed  short-term disability process and work note for two-week absence. - Schedule follow-up in one week to evaluate response to new medication and clear for work return. - Prepare and provide work note for two-week leave, return date May 28, 2024.         Orders Placed This Encounter  Procedures   Ambulatory referral to Neurology    Referral Priority:   Routine    Referral Type:   Consultation    Referral Reason:   Specialty Services Required    Requested Specialty:   Neurology    Number of Visits Requested:   1    Meds ordered this encounter  Medications   rizatriptan (MAXALT-MLT) 10 MG disintegrating tablet    Sig: Take 1 tablet (10 mg total) by mouth as needed for migraine. May repeat in 2 hours if needed    Dispense:  10 tablet    Refill:  2    Follow up plan: Return if symptoms worsen or fail to improve.   Marsa Officer, DO [facility] 05/14/2024, 4:45 PM

## 2024-05-14 NOTE — Patient Instructions (Addendum)
 Thank you for coming to the office today.  Last seen by Lauraine Rocks PA with Neurology - March 2023, for similar migraine headache and vision loss.  Try to get scheduled ASAP if possible to see her other doctor there, I will send a referral just in case .  [facility] - Neurology Dept [address] [place], [place] [address] Phone: [phone]  Take the Ubrelvy 50 or 100mg , once and if need can repeat in 2 hours ( but I would prefer to wait til the next day)  Rizatriptan or maxalt generic also for migraine 10mg , repeat after 2 hours if not fully effective, 10 pill count. Stop Sumatriptan Imitrex don't take both.  Okay to take Ibuprofen  Tylenol Excedrin Toraladol any of these but only one at a time.  Work note for 2 weeks, we can complete short term disability  We will need to see you back in 1 week before you return to work so we can clear you.  Please schedule a Follow-up Appointment to: Return if symptoms worsen or fail to improve.  If you have any other questions or concerns, please feel free to call the office or send a message through MyChart. You may also schedule an earlier appointment if necessary.  Additionally, you may be receiving a survey about your experience at our office within a few days to 1 week by e-mail or mail. We value your feedback.  Marsa Officer, DO [facility], [place]

## 2024-05-23 ENCOUNTER — Encounter: Payer: Self-pay | Admitting: Family Medicine

## 2024-05-23 ENCOUNTER — Ambulatory Visit: Admitting: Family Medicine

## 2024-05-23 VITALS — BP 122/68 | HR 81 | Ht 59.25 in | Wt 181.5 lb

## 2024-05-23 DIAGNOSIS — G43109 Migraine with aura, not intractable, without status migrainosus: Secondary | ICD-10-CM | POA: Diagnosis not present

## 2024-05-23 DIAGNOSIS — M542 Cervicalgia: Secondary | ICD-10-CM

## 2024-05-23 DIAGNOSIS — H532 Diplopia: Secondary | ICD-10-CM

## 2024-05-23 DIAGNOSIS — H5462 Unqualified visual loss, left eye, normal vision right eye: Secondary | ICD-10-CM | POA: Diagnosis not present

## 2024-05-23 MED ORDER — BACLOFEN 10 MG PO TABS: 5.0000 mg | ORAL_TABLET | Freq: Three times a day (TID) | ORAL | 0 refills | Status: AC | PRN

## 2024-05-23 NOTE — Patient Instructions (Addendum)
 Thank you for coming to the office today.  Pleased that headache and vision is improving.  Neurology apt on 11/11  2 more Ubrelvy 100mg  samples  Remaining refills on the Rizatriptan  For the neck pain  Start taking Baclofen (Lioresal) 10mg  (muscle relaxant) - start with half (cut) to one whole pill at night as needed for next 1-3 nights (may make you drowsy, caution with driving) see how it affects you, then if tolerated increase to one pill 2 to 3 times a day or (every 8 hours as needed)  Paperwork was faxed last week 10/30.  You can have a copy of the paperwork before it was faxed   Please schedule a Follow-up Appointment to: Return in about 1 week (around 05/30/2024), or if symptoms worsen or fail to improve, for return 1 week if planning to return sooner, otherwise okay to return to work.  If you have any other questions or concerns, please feel free to call the office or send a message through MyChart. You may also schedule an earlier appointment if necessary.  Additionally, you may be receiving a survey about your experience at our office within a few days to 1 week by e-mail or mail. We value your feedback.  Marsa Officer, DO [facility] [place], [place]

## 2024-05-23 NOTE — Progress Notes (Signed)
 Subjective:    Patient ID: Andrea Flowers, female    DOB: 2001-04-30, 23 y.o.   MRN: [id number]  Andrea Flowers is a 23 y.o. female presenting on 05/23/2024 for Migraine   HPI  Discussed the use of AI scribe software for clinical note transcription with the patient, who gave verbal consent to proceed.  History of Present Illness   Andrea Flowers is a 23 year old female with migraines who presents for follow-up regarding headache management.  Episodic Migraine Headaches, Complicated w/ Vision Loss L Eye Cephalalgia (headache) and migraine symptoms Follow-up from 05/14/24 last visit for initial evaluation, see prior note. She was prescribed migraine abortive therapy, samples given, referred to Neurology.  Improved now. But not 100% resolved.  Andrea Flowers Newer 100 mg provides significant relief; 50 mg dose less effective - Rizatriptan provides temporary relief but does not fully resolve headaches - Currently on short-term disability due to migraines and associated vision issues; paperwork submitted May 17, 2024. They are requested copy of it again - Awaiting neurology evaluation scheduled for May 29, 2024 - She is not ready to return to work given still loss of vision  Cervicalgia (neck pain) - Neck pain localized primarily to the midline posterior cervical region - Pain exacerbated by certain movements, including rising from a lying position and sudden movements - No current medication use for neck pain - Previous use of Flexeril  for shoulder pain, which caused drowsiness          05/23/2024    2:50 PM 02/28/2024    8:20 AM 01/17/2024    9:06 AM  Depression screen PHQ 2/9  Decreased Interest 0 2 2  Down, Depressed, Hopeless 0 2 1  PHQ - 2 Score 0 4 3  Altered sleeping 2 3 3   Tired, decreased energy 0 2 2  Change in appetite 0 2 3  Feeling bad or failure about yourself  0 1 2  Trouble concentrating 0 2 3  Moving slowly or fidgety/restless 0 2 3  Suicidal thoughts 0 1  0  PHQ-9 Score 2 17 19   Difficult doing work/chores Not difficult at all Somewhat difficult Very difficult       05/23/2024    2:51 PM 02/28/2024    8:20 AM 01/17/2024    9:06 AM 09/01/2023    8:16 AM  GAD 7 : Generalized Anxiety Score  Nervous, Anxious, on Edge 1 2 3  0  Control/stop worrying 2 2 3  0  Worry too much - different things 1 2 3  0  Trouble relaxing 2 2 3 2   Restless 1 1 2 1   Easily annoyed or irritable 0 1 1 0  Afraid - awful might happen 0 1 2 0  Total GAD 7 Score 7 11 17 3   Anxiety Difficulty Not difficult at all Somewhat difficult Very difficult Somewhat difficult    Social History   Tobacco Use   Smoking status: Never   Smokeless tobacco: Never  Vaping Use   Vaping status: Former  Substance Use Topics   Alcohol use: No    Alcohol/week: 0.0 standard drinks of alcohol   Drug use: No    Review of Systems Per HPI unless specifically indicated above     Objective:    BP 122/68 (BP Location: Right Arm, Patient Position: Sitting, Cuff Size: Normal)   Pulse 81   Ht 4' 11.25 (1.505 m)   Wt 181 lb 8 oz (82.3 kg)   LMP 05/08/2024   SpO2 98%  BMI 36.35 kg/m   Wt Readings from Last 3 Encounters:  05/23/24 181 lb 8 oz (82.3 kg)  05/14/24 180 lb (81.6 kg)  05/11/24 178 lb 6.4 oz (80.9 kg)    Physical Exam Vitals and nursing note reviewed.  Constitutional:      General: She is not in acute distress.    Appearance: Normal appearance. She is well-developed. She is not diaphoretic.     Comments: Currently well, no acute concern  HENT:     Head: Normocephalic and atraumatic.  Eyes:     General:        Right eye: No discharge.        Left eye: No discharge.     Extraocular Movements: Extraocular movements intact.     Conjunctiva/sclera: Conjunctivae normal.     Pupils: Pupils are equal, round, and reactive to light.     Comments: Improved vision today, sensitivity to light but no provoked double vision  Cardiovascular:     Rate and Rhythm: Normal rate.   Pulmonary:     Effort: Pulmonary effort is normal.  Skin:    General: Skin is warm and dry.     Findings: No erythema or rash.  Neurological:     General: No focal deficit present.     Mental Status: She is alert and oriented to person, place, and time. Mental status is at baseline.     Cranial Nerves: No cranial nerve deficit.     Sensory: No sensory deficit.     Motor: No weakness.  Psychiatric:        Mood and Affect: Mood normal.        Behavior: Behavior normal.        Thought Content: Thought content normal.     Comments: Well groomed, good eye contact, normal speech and thoughts     Results for orders placed or performed in visit on 08/25/23  HIV Antibody (routine testing w rflx)   Collection Time: 08/25/23  8:14 AM  Result Value Ref Range   HIV 1&2 Ab, 4th Generation NON-REACTIVE NON-REACTIVE  Hepatitis C antibody   Collection Time: 08/25/23  8:14 AM  Result Value Ref Range   Hepatitis C Ab NON-REACTIVE NON-REACTIVE  T4, free   Collection Time: 08/25/23  8:14 AM  Result Value Ref Range   Free T4 1.2 0.8 - 1.8 ng/dL  TSH   Collection Time: 08/25/23  8:14 AM  Result Value Ref Range   TSH 1.43 mIU/L  CBC with Differential/Platelet   Collection Time: 08/25/23  8:14 AM  Result Value Ref Range   WBC 7.3 3.8 - 10.8 Thousand/uL   RBC 4.78 3.80 - 5.10 Million/uL   Hemoglobin 13.2 11.7 - 15.5 g/dL   HCT 60.0 64.9 - 54.9 %   MCV 83.5 80.0 - 100.0 fL   MCH 27.6 27.0 - 33.0 pg   MCHC 33.1 32.0 - 36.0 g/dL   RDW 86.5 88.9 - 84.9 %   Platelets 324 140 - 400 Thousand/uL   MPV 10.3 7.5 - 12.5 fL   Neutro Abs 4,555 1,500 - 7,800 cells/uL   Absolute Lymphocytes 2,066 850 - 3,900 cells/uL   Absolute Monocytes 496 200 - 950 cells/uL   Eosinophils Absolute 131 15 - 500 cells/uL   Basophils Absolute 51 0 - 200 cells/uL   Neutrophils Relative % 62.4 %   Total Lymphocyte 28.3 %   Monocytes Relative 6.8 %   Eosinophils Relative 1.8 %   Basophils Relative 0.7 %  COMPLETE  METABOLIC PANEL WITH GFR   Collection Time: 08/25/23  8:14 AM  Result Value Ref Range   Glucose, Bld 101 (H) 65 - 99 mg/dL   BUN 12 7 - 25 mg/dL   Creat 9.39 9.49 - 9.03 mg/dL   eGFR 868 > OR = 60 fO/fpw/8.26f7   BUN/Creatinine Ratio SEE NOTE: 6 - 22 (calc)   Sodium 137 135 - 146 mmol/L   Potassium 4.3 3.5 - 5.3 mmol/L   Chloride 105 98 - 110 mmol/L   CO2 23 20 - 32 mmol/L   Calcium 9.6 8.6 - 10.2 mg/dL   Total Protein 7.2 6.1 - 8.1 g/dL   Albumin 4.3 3.6 - 5.1 g/dL   Globulin 2.9 1.9 - 3.7 g/dL (calc)   AG Ratio 1.5 1.0 - 2.5 (calc)   Total Bilirubin 0.4 0.2 - 1.2 mg/dL   Alkaline phosphatase (APISO) 105 31 - 125 U/L   AST 15 10 - 30 U/L   ALT 15 6 - 29 U/L  Hemoglobin A1c   Collection Time: 08/25/23  8:14 AM  Result Value Ref Range   Hgb A1c MFr Bld 5.7 (H) <5.7 % of total Hgb   Mean Plasma Glucose 117 mg/dL   eAG (mmol/L) 6.5 mmol/L      Assessment & Plan:   Problem List Items Addressed This Visit   None Visit Diagnoses       Complicated migraine    -  Primary   Relevant Medications   baclofen (LIORESAL) 10 MG tablet     Monocular diplopia of left eye         Vision loss, left eye         Neck pain       Relevant Medications   baclofen (LIORESAL) 10 MG tablet       Complicated Migraine with vision loss Migraine with left eye vision loss, similar to previous episode in 2023, with Neurology work up and MRI previously normal as well as ophthalmology evaluation.  Updates since last visit 05/14/24 She is improving on current therapy. Headaches and vision loss have not been entirely resolved yet.  Best relief from Ubrelvy 100mg . No active rx on this, only able to get Rizatriptan at this time. Using Ubrelvy samples temporarily  Neurology apt at [facility] clinic scheduled for 05/29/24 next week   - Provide 2 new samples Ubrelvy samples (100mg ) take at headache onset, repeat after two hours if needed  Re-submit / fax previous short term disability paperwork as it  was already sent last week on 10/30 but the disability company has requested it again this week  Work note given today that explains extension. She is not yet ready to return to work as expected previously on 05/28/24.  It is my medical recommendation that her medical disability leave of absence be extended by an additional 2 weeks to continue her treatment and allow time to see the Neurology specialist. Her symptoms with headache and vision loss have not fully recovered yet, therefore she is unable to return to work as previously expected on 05/28/24.  Extension required 05/28/24 through 06/10/24 Return to work Mon 06/11/24  ADLs impacted with vision loss causing reduced mobility affecting her ADLs with moving, transferring, driving. Pain with migraine headache interferes with appetite and ADLs with meal prep and hygiene. Driving restriction due to vision loss.  Cervicalgia Neck pain likely musculoskeletal, possibly due to headache or muscle strain. Previous Flexeril  use noted, considering alternatives due to side effects. - Prescribed  baclofen as a muscle relaxant.        No orders of the defined types were placed in this encounter.   Meds ordered this encounter  Medications   baclofen (LIORESAL) 10 MG tablet    Sig: Take 0.5-1 tablets (5-10 mg total) by mouth 3 (three) times daily as needed for muscle spasms (neck pain).    Dispense:  60 each    Refill:  0    Follow up plan: Return in about 1 week (around 05/30/2024), or if symptoms worsen or fail to improve, for return 1 week if planning to return sooner, otherwise okay to return to work.   Andrea Officer, DO [facility] 05/23/2024, 3:08 PM

## 2024-05-29 DIAGNOSIS — R519 Headache, unspecified: Secondary | ICD-10-CM | POA: Diagnosis not present

## 2024-05-29 DIAGNOSIS — H539 Unspecified visual disturbance: Secondary | ICD-10-CM | POA: Diagnosis not present

## 2024-05-29 DIAGNOSIS — H532 Diplopia: Secondary | ICD-10-CM | POA: Diagnosis not present

## 2024-08-31 ENCOUNTER — Other Ambulatory Visit: Payer: BC Managed Care – PPO

## 2024-09-10 ENCOUNTER — Ambulatory Visit: Payer: BC Managed Care – PPO | Admitting: Family Medicine
